# Patient Record
Sex: Male | Born: 1950 | Race: Black or African American | Hispanic: No | Smoking: Never smoker
Health system: Southern US, Community
[De-identification: ages and names within clinical notes are randomized; demographics above are authoritative.]

## PROBLEM LIST (undated history)

## (undated) DIAGNOSIS — D496 Neoplasm of unspecified behavior of brain: Secondary | ICD-10-CM

## (undated) DIAGNOSIS — I739 Peripheral vascular disease, unspecified: Secondary | ICD-10-CM

## (undated) DIAGNOSIS — I639 Cerebral infarction, unspecified: Secondary | ICD-10-CM

## (undated) DIAGNOSIS — J449 Chronic obstructive pulmonary disease, unspecified: Secondary | ICD-10-CM

## (undated) DIAGNOSIS — G629 Polyneuropathy, unspecified: Secondary | ICD-10-CM

## (undated) DIAGNOSIS — D649 Anemia, unspecified: Secondary | ICD-10-CM

## (undated) DIAGNOSIS — B1920 Unspecified viral hepatitis C without hepatic coma: Secondary | ICD-10-CM

## (undated) DIAGNOSIS — I10 Essential (primary) hypertension: Secondary | ICD-10-CM

## (undated) DIAGNOSIS — K7460 Unspecified cirrhosis of liver: Secondary | ICD-10-CM

## (undated) DIAGNOSIS — I509 Heart failure, unspecified: Secondary | ICD-10-CM

## (undated) DIAGNOSIS — R569 Unspecified convulsions: Secondary | ICD-10-CM

## (undated) DIAGNOSIS — I4891 Unspecified atrial fibrillation: Secondary | ICD-10-CM

## (undated) DIAGNOSIS — K819 Cholecystitis, unspecified: Secondary | ICD-10-CM

## (undated) DIAGNOSIS — N400 Enlarged prostate without lower urinary tract symptoms: Secondary | ICD-10-CM

## (undated) DIAGNOSIS — Z944 Liver transplant status: Secondary | ICD-10-CM

## (undated) DIAGNOSIS — Z992 Dependence on renal dialysis: Secondary | ICD-10-CM

## (undated) DIAGNOSIS — N186 End stage renal disease: Secondary | ICD-10-CM

## (undated) DIAGNOSIS — I2510 Atherosclerotic heart disease of native coronary artery without angina pectoris: Secondary | ICD-10-CM

## (undated) HISTORY — PX: TONSILLECTOMY: SUR1361

## (undated) HISTORY — PX: LIVER TRANSPLANT: SHX410

## (undated) HISTORY — PX: INSERTION OF DIALYSIS CATHETER: SHX1324

## (undated) HISTORY — PX: APPENDECTOMY: SHX54

---

## 2012-04-16 ENCOUNTER — Emergency Department (HOSPITAL_BASED_OUTPATIENT_CLINIC_OR_DEPARTMENT_OTHER): Payer: Medicare Other

## 2012-04-16 ENCOUNTER — Emergency Department (HOSPITAL_BASED_OUTPATIENT_CLINIC_OR_DEPARTMENT_OTHER)
Admission: EM | Admit: 2012-04-16 | Discharge: 2012-04-16 | Disposition: A | Discharge status: Other Acute Inpatient Hospital | Payer: Medicare Other | Attending: Emergency Medicine | Admitting: Emergency Medicine

## 2012-04-16 ENCOUNTER — Encounter (HOSPITAL_BASED_OUTPATIENT_CLINIC_OR_DEPARTMENT_OTHER): Payer: Self-pay | Admitting: Emergency Medicine

## 2012-04-16 DIAGNOSIS — E119 Type 2 diabetes mellitus without complications: Secondary | ICD-10-CM | POA: Insufficient documentation

## 2012-04-16 DIAGNOSIS — L03116 Cellulitis of left lower limb: Secondary | ICD-10-CM

## 2012-04-16 DIAGNOSIS — I120 Hypertensive chronic kidney disease with stage 5 chronic kidney disease or end stage renal disease: Secondary | ICD-10-CM | POA: Insufficient documentation

## 2012-04-16 DIAGNOSIS — Z8619 Personal history of other infectious and parasitic diseases: Secondary | ICD-10-CM | POA: Insufficient documentation

## 2012-04-16 DIAGNOSIS — Z992 Dependence on renal dialysis: Secondary | ICD-10-CM | POA: Insufficient documentation

## 2012-04-16 DIAGNOSIS — Z944 Liver transplant status: Secondary | ICD-10-CM | POA: Insufficient documentation

## 2012-04-16 DIAGNOSIS — L03115 Cellulitis of right lower limb: Secondary | ICD-10-CM

## 2012-04-16 DIAGNOSIS — Z794 Long term (current) use of insulin: Secondary | ICD-10-CM | POA: Insufficient documentation

## 2012-04-16 DIAGNOSIS — M79609 Pain in unspecified limb: Secondary | ICD-10-CM | POA: Insufficient documentation

## 2012-04-16 DIAGNOSIS — N186 End stage renal disease: Secondary | ICD-10-CM | POA: Insufficient documentation

## 2012-04-16 DIAGNOSIS — L02419 Cutaneous abscess of limb, unspecified: Secondary | ICD-10-CM | POA: Insufficient documentation

## 2012-04-16 HISTORY — DX: Polyneuropathy, unspecified: G62.9

## 2012-04-16 HISTORY — DX: Unspecified cirrhosis of liver: K74.60

## 2012-04-16 HISTORY — DX: Cerebral infarction, unspecified: I63.9

## 2012-04-16 HISTORY — DX: Essential (primary) hypertension: I10

## 2012-04-16 HISTORY — DX: Unspecified atrial fibrillation: I48.91

## 2012-04-16 HISTORY — DX: Chronic obstructive pulmonary disease, unspecified: J44.9

## 2012-04-16 HISTORY — DX: Benign prostatic hyperplasia without lower urinary tract symptoms: N40.0

## 2012-04-16 HISTORY — DX: Unspecified convulsions: R56.9

## 2012-04-16 HISTORY — DX: Dependence on renal dialysis: Z99.2

## 2012-04-16 HISTORY — DX: Heart failure, unspecified: I50.9

## 2012-04-16 HISTORY — DX: Anemia, unspecified: D64.9

## 2012-04-16 HISTORY — DX: Liver transplant status: Z94.4

## 2012-04-16 HISTORY — DX: End stage renal disease: N18.6

## 2012-04-16 HISTORY — DX: Atherosclerotic heart disease of native coronary artery without angina pectoris: I25.10

## 2012-04-16 HISTORY — DX: Peripheral vascular disease, unspecified: I73.9

## 2012-04-16 HISTORY — DX: Cholecystitis, unspecified: K81.9

## 2012-04-16 HISTORY — DX: Unspecified viral hepatitis C without hepatic coma: B19.20

## 2012-04-16 HISTORY — DX: Neoplasm of unspecified behavior of brain: D49.6

## 2012-04-16 LAB — CBC
MCH: 29.6 pg (ref 26.0–34.0)
MCHC: 33.8 g/dL (ref 30.0–36.0)
MCV: 87.5 fL (ref 78.0–100.0)
Platelets: 181 10*3/uL (ref 150–400)
RDW: 12.7 % (ref 11.5–15.5)

## 2012-04-16 LAB — BASIC METABOLIC PANEL
Calcium: 9.3 mg/dL (ref 8.4–10.5)
GFR calc Af Amer: 11 mL/min — ABNORMAL LOW (ref >90)
GFR calc non Af Amer: 10 mL/min — ABNORMAL LOW (ref >90)
Glucose, Bld: 201 mg/dL — ABNORMAL HIGH (ref 70–99)
Potassium: 3.7 mEq/L (ref 3.5–5.1)
Sodium: 134 mEq/L — ABNORMAL LOW (ref 135–145)

## 2012-04-16 LAB — DIFFERENTIAL
Basophils Relative: 0 % (ref 0–1)
Eosinophils Absolute: 0.2 10*3/uL (ref 0.0–0.7)
Eosinophils Relative: 5 % (ref 0–5)
Lymphs Abs: 1.5 10*3/uL (ref 0.7–4.0)
Neutrophils Relative %: 52 % (ref 43–77)

## 2012-04-16 MED ORDER — CEFAZOLIN SODIUM 1-5 GM-% IV SOLN
1.0000 g | Freq: Once | INTRAVENOUS | Status: AC
  Administered 2012-04-16: 1 g via INTRAVENOUS
  Filled 2012-04-16: qty 50

## 2012-04-16 MED ORDER — MORPHINE SULFATE 4 MG/ML IJ SOLN
4.0000 mg | Freq: Once | INTRAMUSCULAR | Status: AC
  Administered 2012-04-16: 4 mg via INTRAVENOUS
  Filled 2012-04-16: qty 1

## 2012-04-16 NOTE — ED Notes (Signed)
Family at bedside. 

## 2012-04-16 NOTE — ED Notes (Signed)
Pt reports fall was Tues, c/o continued pain to LLE

## 2012-04-16 NOTE — ED Notes (Signed)
Attempted x 3 to obtain IV access or blood sample- unable to obtain- MD aware- pt will go for radiology studies at this time before trying again

## 2012-04-16 NOTE — ED Notes (Signed)
Pt states he was standing in bathroom and had some difficulty with movement in legs, fell and now has some skin tears/abrasions to both lower legs.  Pt denies dizziness, loc or lightheadedness, pt relates he has problems moving his legs due to neuropathy.

## 2012-04-16 NOTE — ED Provider Notes (Signed)
History     CSN: [id number]  Arrival date & time 04/16/12  1013   First MD Initiated Contact with Patient 04/16/12 1058      Chief Complaint  Patient presents with  . Fall  . Abrasion    (Consider location/radiation/quality/duration/timing/severity/associated sxs/prior treatment) Patient is a 61 y.o. male presenting with fall. The history is provided by the patient.  Fall   he was in his bathroom on the commode and fell getting off of it. This occurred 4 days ago. He states that his legs were under in an awkward positions and needed assistance getting up. He is not ambulatory and has not been for 5 years. Since the fall, he is complaining of pain in his left hip and both lower legs. Pain is moderate and he rates it at 6/10. He denies fever, chills, sweats. His partner was worried because his legs were abraded and were looking worse.   Past Medical History  Diagnosis Date  . Diabetes mellitus   . Neuropathy   . Hypertension   . ESRD (end stage renal disease)   . Dialysis patient   . Hepatitis C   . Anemia   . Stroke   . PVD (peripheral vascular disease)   . Cirrhosis   . S/P liver transplant   . COPD (chronic obstructive pulmonary disease)   . Seizures   . Coronary artery disease   . CHF (congestive heart failure)   . A-fib   . Prostate hypertrophy   . Brain neoplasm   . Cholecystitis     Past Surgical History  Procedure Date  . Appendectomy     No family history on file.  History  Substance Use Topics  . Smoking status: Never Smoker   . Smokeless tobacco: Not on file  . Alcohol Use: No      Review of Systems  All other systems reviewed and are negative.    Allergies  Review of patient's allergies indicates no known allergies.  Home Medications   Current Outpatient Rx  Name Route Sig Dispense Refill  . ACETAMINOPHEN 325 MG PO TABS Oral Take 650 mg by mouth.    . NEPHROCAPS PO Oral Take by mouth.    Marland Kitchen GABAPENTIN 100 MG PO CAPS Oral Take 100 mg  by mouth.    . INSULIN GLARGINE 100 UNIT/ML [place] SOLN Subcutaneous Inject 42 Units into the skin.    . INSULIN ISOPHANE & REGULAR (70-30) 100 UNIT/ML [place] SUSP Subcutaneous Inject 10 Units into the skin.    Marland Kitchen LEVETIRACETAM 500 MG PO TABS Oral Take 500 mg by mouth.    . METOPROLOL SUCCINATE ER 25 MG PO TB24 Oral Take 25 mg by mouth.    Marland Kitchen MYCOPHENOLATE MOFETIL 250 MG PO CAPS Oral Take 250 mg by mouth.    . OXYCODONE HCL ER 10 MG PO TB12 Oral Take 5 mg by mouth.    Marland Kitchen PREDNISONE 5 MG PO TABS Oral Take 5 mg by mouth.    . SEVELAMER CARBONATE 800 MG PO TABS Oral Take 800 mg by mouth.    Marland Kitchen TACROLIMUS 1 MG PO CAPS Oral Take 1 mg by mouth.    Marland Kitchen URSODIOL 300 MG PO CAPS Oral Take 300 mg by mouth.      BP 135/96  Pulse 98  Temp 98.6 F (37 C) (Oral)  Resp 18  SpO2 100%  Physical Exam  Nursing note and vitals reviewed.  61 year old male is resting comfortably and in no acute distress. Vital  signs are significant for borderline hypertension with blood pressure 135/96. Oxygen saturation is 100% which is normal. Head is normocephalic and atraumatic. PERRLA, EOMI. Neck is nontender and supple. Back is nontender. Lungs are clear without rales, wheezes, or rhonchi. Heart has regular rate rhythm without murmur. Abdomen is soft, flat, nontender without masses or hepatosplenomegaly. Extremities: Skin tears are present over the lower legs of with erythema and warmth in that area.  Venous stasis changes are also present. No lymphangitic streaks are seen. There is soft tissue tenderness throughout both lower legs and also in the left lateral hip and thigh. No ecchymoses are seen. There is trace edema. Skin is warm and dry without other rash. Neurologic: Mental status is normal, cranial nerves are intact. There is marked weakness of both legs which the patient states is chronic. Arm strength is normal.  ED Course  Procedures (including critical care time)  Results for orders placed during the hospital encounter of  04/16/12  CBC      Component Value Range   WBC 5.3  4.0 - 10.5 K/uL   RBC 3.75 (*) 4.22 - 5.81 MIL/uL   Hemoglobin 11.1 (*) 13.0 - 17.0 g/dL   HCT 78.4 (*) 69.6 - 29.5 %   MCV 87.5  78.0 - 100.0 fL   MCH 29.6  26.0 - 34.0 pg   MCHC 33.8  30.0 - 36.0 g/dL   RDW 28.4  13.2 - 44.0 %   Platelets 181  150 - 400 K/uL  DIFFERENTIAL      Component Value Range   Neutrophils Relative 52  43 - 77 %   Neutro Abs 2.8  1.7 - 7.7 K/uL   Lymphocytes Relative 28  12 - 46 %   Lymphs Abs 1.5  0.7 - 4.0 K/uL   Monocytes Relative 16 (*) 3 - 12 %   Monocytes Absolute 0.8  0.1 - 1.0 K/uL   Eosinophils Relative 5  0 - 5 %   Eosinophils Absolute 0.2  0.0 - 0.7 K/uL   Basophils Relative 0  0 - 1 %   Basophils Absolute 0.0  0.0 - 0.1 K/uL  BASIC METABOLIC PANEL      Component Value Range   Sodium 134 (*) 135 - 145 mEq/L   Potassium 3.7  3.5 - 5.1 mEq/L   Chloride 97  96 - 112 mEq/L   CO2 29  19 - 32 mEq/L   Glucose, Bld 201 (*) 70 - 99 mg/dL   BUN 8  6 - 23 mg/dL   Creatinine, Ser 1.02 (*) 0.50 - 1.35 mg/dL   Calcium 9.3  8.4 - 72.5 mg/dL   GFR calc non Af Amer 10 (*) >90 mL/min   GFR calc Af Amer 11 (*) >90 mL/min  SEDIMENTATION RATE      Component Value Range   Sed Rate 75 (*) 0 - 16 mm/hr   Dg Femur Left  04/16/2012  *RADIOLOGY REPORT*  Clinical Data: History of fall with abrasion.  LEFT FEMUR - 2 VIEW  Comparison: No priors.  Findings: AP and lateral views of the left femur demonstrate postoperative changes of the intramedullary rod (gamma nail) and screw fixation.  No obvious periprostatic fracture is identified. The femur appears intact.  Soft tissues are unremarkable.  IMPRESSION: 1.  No acute abnormality of the left femur. 2.  Status post ORIF, as above.  Original Report Authenticated By: Florencia Reasons, M.D.   Dg Tibia/fibula Left  04/16/2012  *RADIOLOGY REPORT*  Clinical  Data: History of fall with of the brain agent and left leg pain.  LEFT TIBIA AND FIBULA - 2 VIEW  Comparison: No  priors.  Findings: AP and lateral views of the left tibia and fibula demonstrate no acute displaced fractures.  Overlying soft tissues are unremarkable.  IMPRESSION: 1.  No acute radiographic abnormality of the left tibia or fibula.  Original Report Authenticated By: Florencia Reasons, M.D.   Dg Tibia/fibula Right  04/16/2012  *RADIOLOGY REPORT*  Clinical Data: History of fall with abrasion.  RIGHT TIBIA AND FIBULA - 2 VIEW  Comparison: No priors.  Findings: AP lateral views of the right tibia and fibula demonstrate no acute displaced fractures.  Overlying soft tissues are unremarkable.  IMPRESSION: 1.  Negative for acute fracture of the right tibia or fibula.  Original Report Authenticated By: Florencia Reasons, M.D.   US Venous Img Lower Bilateral  04/16/2012  *RADIOLOGY REPORT*  Clinical Data: History of recent fall complaining of bilateral leg pain.  BILATERAL LOWER EXTREMITY VENOUS DUPLEX ULTRASOUND  Technique:  Gray-scale sonography with graded compression, as well as color Doppler and duplex ultrasound, were performed to evaluate the deep venous system of both lower extremities from the level of the common femoral vein through the popliteal and proximal calf veins.  Spectral Doppler was utilized to evaluate flow at rest and with distal augmentation maneuvers.  Comparison:  No priors.  Findings:  Normal compressibility of bilateral common femoral, superficial femoral, and popliteal veins is demonstrated, as well as the visualized proximal calf veins.  No filling defects to suggest DVT on grayscale or color Doppler imaging.  Doppler waveforms show normal direction of venous flow, normal respiratory phasicity and response to augmentation.  IMPRESSION: No evidence of deep vein thrombosis in either lower extremity.  Original Report Authenticated By: Florencia Reasons, M.D.      1. Cellulitis of leg, left   2. Cellulitis of leg, right   3. End stage renal disease on dialysis       MDM  Leg  pain with erythema and warmth which most likely represents cellulitis. Doppler will be obtained to rule out DVT. He is started on Ancef.  Laboratory was unable to obtain blood for testing. Right femoral vein blood draw was done by myself without difficulty. Prior to drawing blood, a timeout was performed and patient identified by stating his name and by armband. He tolerated the procedure well.  The patient needs to be admitted for inability to care for himself and progression of minor abrasions to cellulitis. He states that all of his medical care has been done at [facility] and prefers that he be admitted over there. Case is been discussed with Dr. Dierdre Forth at [facility] who agrees to accept the patient in transfer.     Dione Booze, MD 04/16/12 1620

## 2012-05-22 ENCOUNTER — Encounter (HOSPITAL_BASED_OUTPATIENT_CLINIC_OR_DEPARTMENT_OTHER): Payer: Self-pay | Admitting: Emergency Medicine

## 2012-05-22 ENCOUNTER — Emergency Department (HOSPITAL_BASED_OUTPATIENT_CLINIC_OR_DEPARTMENT_OTHER): Payer: Medicare Other

## 2012-05-22 ENCOUNTER — Emergency Department (HOSPITAL_BASED_OUTPATIENT_CLINIC_OR_DEPARTMENT_OTHER)
Admission: EM | Admit: 2012-05-22 | Discharge: 2012-05-22 | Disposition: A | Discharge status: Skilled Nursing Facility | Payer: Medicare Other | Attending: Emergency Medicine | Admitting: Emergency Medicine

## 2012-05-22 DIAGNOSIS — R51 Headache: Secondary | ICD-10-CM | POA: Insufficient documentation

## 2012-05-22 DIAGNOSIS — M549 Dorsalgia, unspecified: Secondary | ICD-10-CM | POA: Insufficient documentation

## 2012-05-22 DIAGNOSIS — Y9210 Unspecified residential institution as the place of occurrence of the external cause: Secondary | ICD-10-CM | POA: Insufficient documentation

## 2012-05-22 DIAGNOSIS — Z9089 Acquired absence of other organs: Secondary | ICD-10-CM | POA: Insufficient documentation

## 2012-05-22 DIAGNOSIS — M542 Cervicalgia: Secondary | ICD-10-CM | POA: Insufficient documentation

## 2012-05-22 DIAGNOSIS — I120 Hypertensive chronic kidney disease with stage 5 chronic kidney disease or end stage renal disease: Secondary | ICD-10-CM | POA: Insufficient documentation

## 2012-05-22 DIAGNOSIS — Z79899 Other long term (current) drug therapy: Secondary | ICD-10-CM | POA: Insufficient documentation

## 2012-05-22 DIAGNOSIS — Z794 Long term (current) use of insulin: Secondary | ICD-10-CM | POA: Insufficient documentation

## 2012-05-22 DIAGNOSIS — J4489 Other specified chronic obstructive pulmonary disease: Secondary | ICD-10-CM | POA: Insufficient documentation

## 2012-05-22 DIAGNOSIS — W19XXXA Unspecified fall, initial encounter: Secondary | ICD-10-CM

## 2012-05-22 DIAGNOSIS — J449 Chronic obstructive pulmonary disease, unspecified: Secondary | ICD-10-CM | POA: Insufficient documentation

## 2012-05-22 DIAGNOSIS — Z992 Dependence on renal dialysis: Secondary | ICD-10-CM | POA: Insufficient documentation

## 2012-05-22 DIAGNOSIS — Z8673 Personal history of transient ischemic attack (TIA), and cerebral infarction without residual deficits: Secondary | ICD-10-CM | POA: Insufficient documentation

## 2012-05-22 DIAGNOSIS — E119 Type 2 diabetes mellitus without complications: Secondary | ICD-10-CM | POA: Insufficient documentation

## 2012-05-22 DIAGNOSIS — W050XXA Fall from non-moving wheelchair, initial encounter: Secondary | ICD-10-CM | POA: Insufficient documentation

## 2012-05-22 DIAGNOSIS — N186 End stage renal disease: Secondary | ICD-10-CM | POA: Insufficient documentation

## 2012-05-22 MED ORDER — OXYCODONE-ACETAMINOPHEN 5-325 MG PO TABS
1.0000 | ORAL_TABLET | Freq: Once | ORAL | Status: AC
  Administered 2012-05-22: 1 via ORAL
  Filled 2012-05-22: qty 1

## 2012-05-22 NOTE — ED Notes (Signed)
Returned from CT.

## 2012-05-22 NOTE — ED Notes (Signed)
Patient transported to X-ray 

## 2012-05-22 NOTE — ED Notes (Signed)
Patient transported to CT 

## 2012-05-22 NOTE — ED Notes (Signed)
Pt. States that he was transferring from his bed to his w/c at the nursing home today when he fell (states breaks were loose on the w/c) pt. Denies loc. C/o lower back pain. Unsure if he hit his head or not. Pt. Arrived via [facility] with c/o lower back pain and frontal h/a. Pt. Has dried blood to right nares. States she picks at a scab that causes it to bleed. No other complaints.resp even and unlabored.

## 2012-05-22 NOTE — ED Notes (Signed)
Report called to [facility] at [facility]. Aware pt. Will be returning back to facility by PTAR .

## 2012-05-22 NOTE — ED Notes (Addendum)
[facility] called for transportation back to [facility] 

## 2012-05-22 NOTE — ED Notes (Signed)
ptar arrived and report given. Pt. Transported back to [facility].

## 2012-05-22 NOTE — ED Provider Notes (Addendum)
History     CSN: [id number]  Arrival date & time 05/22/12  0122   First MD Initiated Contact with Patient 05/22/12 0126      No chief complaint on file.   (Consider location/radiation/quality/duration/timing/severity/associated sxs/prior treatment) HPI Comments: At approximately noon yesterday patient was attempting to get from his bed to his wheelchair and fell onto his buttocks.  He did not lose consciousness.  Patient now has back pain.  He also complains of neck pain and mild headache.  Patient had remained at his nursing home throughout the day with they have been treating his pain with Vicodin.  He did not note any relief and the nursing home opted to send him to the emergency department.  Patient denies abdominal pain, nausea, vomiting, chest pain or shortness of breath.  There is no new weakness or numbness.  Patient uses a walker or wheelchair at baseline.  The history is provided by the patient and the EMS personnel.    Past Medical History  Diagnosis Date  . Diabetes mellitus   . Neuropathy   . Hypertension   . ESRD (end stage renal disease)   . Dialysis patient   . Hepatitis C   . Anemia   . Stroke   . PVD (peripheral vascular disease)   . Cirrhosis   . S/P liver transplant   . COPD (chronic obstructive pulmonary disease)   . Seizures   . Coronary artery disease   . CHF (congestive heart failure)   . A-fib   . Prostate hypertrophy   . Brain neoplasm   . Cholecystitis     Past Surgical History  Procedure Date  . Appendectomy     History reviewed. No pertinent family history.  History  Substance Use Topics  . Smoking status: Never Smoker   . Smokeless tobacco: Not on file  . Alcohol Use: No      Review of Systems  Constitutional: Negative.  Negative for fever and chills.  Eyes: Negative.   Respiratory: Negative.  Negative for cough and shortness of breath.   Cardiovascular: Negative.  Negative for chest pain.  Gastrointestinal: Negative.   Negative for nausea, vomiting and abdominal pain.  Genitourinary: Negative.   Musculoskeletal: Positive for back pain.  Skin: Negative.  Negative for color change and rash.  Neurological: Positive for headaches. Negative for syncope.  Hematological: Negative.  Negative for adenopathy.  Psychiatric/Behavioral: Negative.  Negative for confusion.  All other systems reviewed and are negative.    Allergies  Review of patient's allergies indicates no known allergies.  Home Medications   Current Outpatient Rx  Name Route Sig Dispense Refill  . GABAPENTIN 100 MG PO CAPS Oral Take 100 mg by mouth 2 (two) times daily.     . INSULIN GLARGINE 100 UNIT/ML [place] SOLN Subcutaneous Inject 50 Units into the skin at bedtime.     . INSULIN ISOPHANE & REGULAR (70-30) 100 UNIT/ML [place] SUSP Subcutaneous Inject 14 Units into the skin 3 (three) times daily as needed. According to sliding scale for blood sugar over 150    . LOPERAMIDE HCL 2 MG PO TABS Oral Take 2 mg by mouth 3 (three) times daily as needed. For diarrhea    . METOPROLOL SUCCINATE ER 25 MG PO TB24 Oral Take 6.25 mg by mouth 2 (two) times daily.     . OXYCODONE HCL ER 10 MG PO TB12 Oral Take 10 mg by mouth every 12 (twelve) hours as needed. For pain    . PREDNISONE  5 MG PO TABS Oral Take 5 mg by mouth daily.     Marland Kitchen SEVELAMER CARBONATE 800 MG PO TABS Oral Take 2,400 mg by mouth 3 (three) times daily with meals.    Marland Kitchen TACROLIMUS 1 MG PO CAPS Oral Take 1 mg by mouth 2 (two) times daily.     Marland Kitchen URSODIOL 300 MG PO CAPS Oral Take 300 mg by mouth 3 (three) times daily.       There were no vitals taken for this visit.  Physical Exam  Vitals reviewed. Constitutional: He is oriented to person, place, and time. He appears well-developed and well-nourished.  Non-toxic appearance. He does not have a sickly appearance.  HENT:  Head: Normocephalic and atraumatic.       No scalp hematomas or deformities noted.  Eyes: Conjunctivae, EOM and lids are normal. Pupils  are equal, round, and reactive to light.  Neck: Trachea normal, normal range of motion and full passive range of motion without pain. Neck supple.  Cardiovascular: Regular rhythm and normal heart sounds.   Pulmonary/Chest: Effort normal and breath sounds normal. No respiratory distress.  Abdominal: Soft. Normal appearance. He exhibits no distension. There is no tenderness. There is no rebound and no CVA tenderness.  Musculoskeletal: Normal range of motion.       No C-spine tenderness or thoracic tenderness on exam.  Patient does have upper L-spine tenderness over approximately L1-2.  Pelvis is stable.  No lower extremity tenderness on exam.  Neurological: He is alert and oriented to person, place, and time. He has normal strength.  Skin: Skin is warm, dry and intact. No rash noted.  Psychiatric: He has a normal mood and affect. His behavior is normal. Judgment and thought content normal.    ED Course  Procedures (including critical care time)  Dg Lumbar Spine Complete  05/22/2012  *RADIOLOGY REPORT*  Clinical Data: Larey Seat 01/02 pain after fall.  LUMBAR SPINE - COMPLETE 4+ VIEW  Comparison: None.  Findings: Five lumbar type vertebral bodies.  Diffuse bone demineralization.  Compression of the superior endplates of T12 and L1.  These are age indeterminate.  No displacement.  Mild ballooning of interspace at L1-2.  There is irregular change in the L4-5 D S with ballooning of the interspace and compression of L5. Disc sclerosis and possible destruction is present.  There appears to be coalition from L4-L5.  Changes may represent old changes of diskitis or postoperative change.  Degenerative changes in the facet joints.  IMPRESSION: Diffuse bone demineralization with blunting of interspaces suggesting osteoporosis.  Anterior compression of T12-L1, age indeterminate.  Acute fractures not excluded.  Described changes at L4-5 interspace may represent postoperative or changes of old diskitis.  Original Report  Authenticated By: Marlon Pel, M.D.   Ct Thoracic Spine Wo Contrast  05/22/2012  *RADIOLOGY REPORT*  Clinical Data:  Fall with pain.  Possible fractures of T12-L1 on plain films.  CT THORACIC AND LUMBAR SPINE WITHOUT CONTRAST  Technique:  Multidetector CT imaging of the thoracic and lumbar spine was performed without contrast. Multiplanar CT image reconstructions were also generated.  Comparison:  Plain radiographs 05/22/2012  CT THORACIC SPINE  Findings:  The normal alignment of the thoracic vertebrae.  Diffuse bone demineralization.  Endplate compression deformities at T10 and T12.  Associated endplate hypertrophic degenerative changes are present and there is no paraspinal hematoma at these levels, suggesting views to represent chronic change.  No retropulsion of fracture fragments.  No focal bone lesion or bone destruction.  IMPRESSION: Chronic-appearing compression fractures at T10 and T12, likely due to osteoporosis.  CT LUMBAR SPINE  Findings: Anterior endplate compression fractures of L1, L2, and L5.  There are associated degenerative changes at these levels and there is no significant paraspinal hematoma, suggesting these represent acute change.  No retropulsion of fracture fragments.  No focal bone lesion or bone destruction.  Postoperative changes in the left hip.  Diffuse bone demineralization.  14 mm exophytic lesion arising from the right kidney.  This is not definitely cystic and could represent a solid lesion.  Consider ultrasound for further evaluation to exclude a solid renal lesion.  IMPRESSION: Diffuse bone demineralization with compression fractures of L1, L2, and L5.  No acute changes are demonstrated, suggesting chronic compression, likely due to osteoporosis.  Indeterminate 14 mm lesion arising from the right kidney.  Original Report Authenticated By: Marlon Pel, M.D.   Ct Lumbar Spine Wo Contrast  05/22/2012  *RADIOLOGY REPORT*  Clinical Data:  Fall with pain.  Possible  fractures of T12-L1 on plain films.  CT THORACIC AND LUMBAR SPINE WITHOUT CONTRAST  Technique:  Multidetector CT imaging of the thoracic and lumbar spine was performed without contrast. Multiplanar CT image reconstructions were also generated.  Comparison:  Plain radiographs 05/22/2012  CT THORACIC SPINE  Findings:  The normal alignment of the thoracic vertebrae.  Diffuse bone demineralization.  Endplate compression deformities at T10 and T12.  Associated endplate hypertrophic degenerative changes are present and there is no paraspinal hematoma at these levels, suggesting views to represent chronic change.  No retropulsion of fracture fragments.  No focal bone lesion or bone destruction.  IMPRESSION: Chronic-appearing compression fractures at T10 and T12, likely due to osteoporosis.  CT LUMBAR SPINE  Findings: Anterior endplate compression fractures of L1, L2, and L5.  There are associated degenerative changes at these levels and there is no significant paraspinal hematoma, suggesting these represent acute change.  No retropulsion of fracture fragments.  No focal bone lesion or bone destruction.  Postoperative changes in the left hip.  Diffuse bone demineralization.  14 mm exophytic lesion arising from the right kidney.  This is not definitely cystic and could represent a solid lesion.  Consider ultrasound for further evaluation to exclude a solid renal lesion.  IMPRESSION: Diffuse bone demineralization with compression fractures of L1, L2, and L5.  No acute changes are demonstrated, suggesting chronic compression, likely due to osteoporosis.  Indeterminate 14 mm lesion arising from the right kidney.  Original Report Authenticated By: Marlon Pel, M.D.      MDM  Patient presents after a fall that occurred earlier in the day.  As he does have point tenderness over his L-spine I will obtain L-spine x-rays.  Patient received Percocet for pain control.  He has no bony C-spine tenderness his C-spine was  cleared clinically.  While patient notes a mild headache he did not have loss of consciousness and is alert and oriented at this time over 12 hours after the fall.  I do not feel the patient requires head CT without signs of trauma or altered mental status at this time.        Nat Christen, MD 05/22/12 [phone]  Patient with possible fracture seen on plain x-ray so a CT scan of his thoracic and lumbar spine will be ordered.  Nat Christen, MD 05/22/12 [phone]  Patient was CT scans that are consistent with chronic changes and nothing acute at this time.  Patient has been reassessed and  is much more comfortable at this time.  I had to wake him from sleep to discuss his test results with him.  I believe the patient is safe for discharge back to his nursing facility.  Nat Christen, MD 05/22/12 [phone]

## 2012-05-22 NOTE — ED Notes (Signed)
Pt. Smelled of urine on arrival. EMT provided incont care to pt.

## 2012-05-22 NOTE — ED Notes (Signed)
Returned from xray

## 2013-03-15 ENCOUNTER — Other Ambulatory Visit: Payer: Self-pay | Admitting: Geriatric Medicine

## 2013-12-15 ENCOUNTER — Other Ambulatory Visit: Payer: Self-pay | Admitting: *Deleted

## 2013-12-15 MED ORDER — OXYCODONE HCL 5 MG PO TABS: ORAL_TABLET | ORAL | Status: DC

## 2013-12-15 NOTE — Telephone Encounter (Signed)
[facility] 

## 2013-12-18 ENCOUNTER — Non-Acute Institutional Stay (SKILLED_NURSING_FACILITY): Payer: Medicare Other | Admitting: Nurse Practitioner

## 2013-12-18 DIAGNOSIS — J449 Chronic obstructive pulmonary disease, unspecified: Secondary | ICD-10-CM

## 2013-12-18 DIAGNOSIS — N186 End stage renal disease: Secondary | ICD-10-CM

## 2013-12-18 DIAGNOSIS — E119 Type 2 diabetes mellitus without complications: Secondary | ICD-10-CM

## 2013-12-18 DIAGNOSIS — M129 Arthropathy, unspecified: Secondary | ICD-10-CM

## 2013-12-18 DIAGNOSIS — S7290XA Unspecified fracture of unspecified femur, initial encounter for closed fracture: Secondary | ICD-10-CM

## 2013-12-18 DIAGNOSIS — R5381 Other malaise: Secondary | ICD-10-CM

## 2013-12-18 DIAGNOSIS — S7292XA Unspecified fracture of left femur, initial encounter for closed fracture: Secondary | ICD-10-CM

## 2013-12-18 DIAGNOSIS — J189 Pneumonia, unspecified organism: Secondary | ICD-10-CM

## 2013-12-18 DIAGNOSIS — M1990 Unspecified osteoarthritis, unspecified site: Secondary | ICD-10-CM

## 2013-12-18 DIAGNOSIS — Z944 Liver transplant status: Secondary | ICD-10-CM

## 2013-12-18 DIAGNOSIS — Z992 Dependence on renal dialysis: Secondary | ICD-10-CM

## 2013-12-19 ENCOUNTER — Encounter: Payer: Self-pay | Admitting: Nurse Practitioner

## 2013-12-19 DIAGNOSIS — S7292XA Unspecified fracture of left femur, initial encounter for closed fracture: Secondary | ICD-10-CM | POA: Insufficient documentation

## 2013-12-19 DIAGNOSIS — Z992 Dependence on renal dialysis: Secondary | ICD-10-CM

## 2013-12-19 DIAGNOSIS — Z944 Liver transplant status: Secondary | ICD-10-CM | POA: Insufficient documentation

## 2013-12-19 DIAGNOSIS — N186 End stage renal disease: Secondary | ICD-10-CM | POA: Insufficient documentation

## 2013-12-19 DIAGNOSIS — J449 Chronic obstructive pulmonary disease, unspecified: Secondary | ICD-10-CM | POA: Insufficient documentation

## 2013-12-19 DIAGNOSIS — R5381 Other malaise: Secondary | ICD-10-CM | POA: Insufficient documentation

## 2013-12-19 DIAGNOSIS — E119 Type 2 diabetes mellitus without complications: Secondary | ICD-10-CM | POA: Insufficient documentation

## 2013-12-19 DIAGNOSIS — J189 Pneumonia, unspecified organism: Secondary | ICD-10-CM | POA: Insufficient documentation

## 2013-12-19 DIAGNOSIS — M1990 Unspecified osteoarthritis, unspecified site: Secondary | ICD-10-CM | POA: Insufficient documentation

## 2013-12-19 NOTE — Progress Notes (Signed)
Patient ID: Tyrone Berry, male   DOB: 31-Oct-1951, 63 y.o.   MRN: [id number]    Nursing Home Location:  [place] of Service: SNF (31)  PCP: No primary provider on file.  No Known Allergies  Chief Complaint  Patient presents with  . Hospitalization Follow-up    HPI:  63 year old male who is at [place] for rehab and medical management after hospitalization for fall at home and found to have left femur fracture and sepsis due to pneumonia; pt was treated medical for fall and is now in split; has follow up in 2 weeks with ortho. pt with PMH of DM, HTN, ESRD on dialysis, liver transplant, COPD, arthritis with chronic pain, ORIF. Nursing reports pt complaining of pain and pain medications are not what he was taking prior to admission from hospital; pt with same complaint otherwise doing well  Review of Systems:  Review of Systems  Constitutional: Negative for fever and chills.  Respiratory: Negative for cough, sputum production and shortness of breath.   Cardiovascular: Negative for chest pain.  Gastrointestinal: Negative for heartburn, abdominal pain, diarrhea and constipation.  Genitourinary: Negative for dysuria.  Musculoskeletal: Positive for back pain, joint pain and myalgias.  Skin: Negative.   Neurological: Negative for dizziness and headaches.  Psychiatric/Behavioral: Negative for depression.     Past Medical History  Diagnosis Date  . Diabetes mellitus   . Neuropathy   . Hypertension   . ESRD (end stage renal disease)   . Dialysis patient   . Hepatitis C   . Anemia   . Stroke   . PVD (peripheral vascular disease)   . Cirrhosis   . S/P liver transplant   . COPD (chronic obstructive pulmonary disease)   . Seizures   . Coronary artery disease   . CHF (congestive heart failure)   . A-fib   . Prostate hypertrophy   . Brain neoplasm   . Cholecystitis    No past surgical history on file. Social History:   reports that he has never smoked.  He does not have any smokeless tobacco history on file. He reports that he does not drink alcohol or use illicit drugs.  No family history on file.  Medications: Patient's Medications  New Prescriptions   No medications on file  Previous Medications   AMITRIPTYLINE (ELAVIL) 50 MG TABLET    Take 50 mg by mouth daily.   CHOLECALCIFEROL (VITAMIN D) 1000 UNITS TABLET    Take 1,000 Units by mouth daily.   DILTIAZEM (CARDIZEM CD) 240 MG 24 HR CAPSULE    Take 240 mg by mouth daily.   EPOETIN ALFA (EPOGEN,PROCRIT) 62952 UNIT/ML INJECTION    10,000 Units 3 (three) times a week. At dialysis   FLUTICASONE (FLONASE) 50 MCG/ACT NASAL SPRAY    Place 1 spray into both nostrils daily.   FLUTICASONE-SALMETEROL (ADVAIR HFA) 45-21 MCG/ACT INHALER    Inhale 2 puffs into the lungs 2 (two) times daily.   GABAPENTIN (NEURONTIN) 100 MG CAPSULE    Take 100 mg by mouth 2 (two) times daily.    INSULIN GLARGINE (LANTUS) 100 UNIT/ML INJECTION    Inject 35 Units into the skin at bedtime.    LEVOTHYROXINE (SYNTHROID, LEVOTHROID) 50 MCG TABLET    Take 50 mcg by mouth daily before breakfast.   LOPERAMIDE (IMODIUM A-D) 2 MG TABLET    Take 2 mg by mouth 3 (three) times daily as needed. For diarrhea   MYCOPHENOLATE (CELLCEPT) 250 MG  CAPSULE    Take 250 mg by mouth 2 (two) times daily.   OXYCODONE (ROXICODONE) 5 MG IMMEDIATE RELEASE TABLET    Take one tablet by mouth every 4 hours as needed for mild to moderate pain; Take two tablets by mouth every 4 hours as needed for severe pain   PREDNISONE (DELTASONE) 5 MG TABLET    Take 5 mg by mouth daily.    SEVELAMER (RENVELA) 800 MG TABLET    Take 800 mg by mouth 3 (three) times daily with meals. Takes 4000 mg with meals   TACROLIMUS (PROGRAF) 1 MG CAPSULE    Take 1 mg by mouth 2 (two) times daily.    URSODIOL (ACTIGALL) 300 MG CAPSULE    Take 300 mg by mouth 3 (three) times daily.   Modified Medications   No medications on file  Discontinued Medications   INSULIN NPH-INSULIN  REGULAR (NOVOLIN 70/30) (70-30) 100 UNIT/ML INJECTION    Inject 14 Units into the skin 3 (three) times daily as needed. According to sliding scale for blood sugar over 150   LEVETIRACETAM (KEPPRA) 500 MG TABLET    Take 500 mg by mouth daily.   METOPROLOL SUCCINATE (TOPROL-XL) 25 MG 24 HR TABLET    Take 6.25 mg by mouth 2 (two) times daily.    OXYCODONE (OXYCONTIN) 10 MG 12 HR TABLET    Take 10 mg by mouth every 12 (twelve) hours as needed. For pain   TAMSULOSIN HCL (FLOMAX) 0.4 MG CAPS    Take 0.4 mg by mouth.     Physical Exam: Physical Exam  Constitutional: He is well-developed, well-nourished, and in no distress.  HENT:  Mouth/Throat: Oropharynx is clear and moist. No oropharyngeal exudate.  Eyes: Conjunctivae and EOM are normal. Pupils are equal, round, and reactive to light.  Neck: Normal range of motion. Neck supple.  Cardiovascular: Normal rate, regular rhythm and normal heart sounds.   Pulmonary/Chest: Effort normal and breath sounds normal. No respiratory distress.  Dialysis cath on right upper chest wall  Abdominal: Soft. Bowel sounds are normal. He exhibits no distension.  Musculoskeletal: He exhibits no edema.  Neurological: He is alert.  Skin: Skin is warm and dry.  Psychiatric: Affect normal.    Filed Vitals:   12/18/13 1207  BP: 114/84  Pulse: 95  Temp: 97.4 F (36.3 C)  Resp: 20       Assessment/Plan 1. Pneumonia -finished course of Levaquin; no ongoing symptoms    2. Femur fracture, left -s/p fall now with femur fracture, in split currently; has not had surgery-- will follo wup with ortho in 2 weeks -pain not well controlled  -will increase medication at this time, will request records from pain management clinic  3. ESRD on dialysis MWF dialysis  4. Hx of liver transplant -conts on rejection prophylaxis medication  5. Debility -s/p fall, here at [facility] for therapy however he is currently non weight bearing; following with ortho  6.  Diabetes -conts on levimir and SSI; will cont to monitor and adjust medication as needed  7. COPD (chronic obstructive pulmonary disease) -no changes in shortness of breath, cough or sputum production; stable on advair  8. Arthritis -with CHRONIC pain, appears to be worse now due to fracture, pt reports pain was managed by outpt pain management and he was getting oxy IR 30 mg q 4 hours PRN, no record of this that I can find, will increase Oxy IR to 20 mg q 6 AS NEEDED and request records from  pain management

## 2013-12-26 ENCOUNTER — Non-Acute Institutional Stay (SKILLED_NURSING_FACILITY): Payer: Medicare Other | Admitting: Internal Medicine

## 2013-12-26 DIAGNOSIS — E1129 Type 2 diabetes mellitus with other diabetic kidney complication: Secondary | ICD-10-CM

## 2013-12-26 DIAGNOSIS — E1149 Type 2 diabetes mellitus with other diabetic neurological complication: Secondary | ICD-10-CM

## 2013-12-26 DIAGNOSIS — E1165 Type 2 diabetes mellitus with hyperglycemia: Secondary | ICD-10-CM

## 2013-12-26 DIAGNOSIS — S7290XA Unspecified fracture of unspecified femur, initial encounter for closed fracture: Secondary | ICD-10-CM

## 2013-12-26 DIAGNOSIS — J449 Chronic obstructive pulmonary disease, unspecified: Secondary | ICD-10-CM

## 2013-12-26 DIAGNOSIS — S7292XA Unspecified fracture of left femur, initial encounter for closed fracture: Secondary | ICD-10-CM
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
## 2014-01-08 ENCOUNTER — Other Ambulatory Visit: Payer: Self-pay | Admitting: *Deleted

## 2014-01-08 MED ORDER — OXYCODONE HCL 10 MG PO TABS: ORAL_TABLET | ORAL | Status: DC

## 2014-01-08 MED ORDER — OXYCODONE HCL 5 MG PO TABS: ORAL_TABLET | ORAL | Status: DC

## 2014-01-08 NOTE — Telephone Encounter (Signed)
[facility] 

## 2014-01-15 ENCOUNTER — Non-Acute Institutional Stay (SKILLED_NURSING_FACILITY): Payer: Medicare Other | Admitting: Nurse Practitioner

## 2014-01-15 DIAGNOSIS — Z992 Dependence on renal dialysis: Secondary | ICD-10-CM

## 2014-01-15 DIAGNOSIS — J4489 Other specified chronic obstructive pulmonary disease: Secondary | ICD-10-CM

## 2014-01-15 DIAGNOSIS — Z944 Liver transplant status: Secondary | ICD-10-CM

## 2014-01-15 DIAGNOSIS — E119 Type 2 diabetes mellitus without complications: Secondary | ICD-10-CM

## 2014-01-15 DIAGNOSIS — M1990 Unspecified osteoarthritis, unspecified site: Secondary | ICD-10-CM

## 2014-01-15 DIAGNOSIS — N186 End stage renal disease: Secondary | ICD-10-CM

## 2014-01-15 DIAGNOSIS — S7292XA Unspecified fracture of left femur, initial encounter for closed fracture: Secondary | ICD-10-CM

## 2014-01-15 DIAGNOSIS — M129 Arthropathy, unspecified: Secondary | ICD-10-CM

## 2014-01-15 DIAGNOSIS — S7290XA Unspecified fracture of unspecified femur, initial encounter for closed fracture: Secondary | ICD-10-CM

## 2014-01-15 DIAGNOSIS — J449 Chronic obstructive pulmonary disease, unspecified: Secondary | ICD-10-CM

## 2014-01-15 NOTE — Progress Notes (Signed)
Patient ID: Tyrone Berry, male   DOB: Jul 15, 1951, 63 y.o.   MRN: [id number]   Nursing Home Location:  [place] of Service: SNF (31)  PCP: No primary provider on file.  No Known Allergies  Chief Complaint  Patient presents with  . Medical Managment of Chronic Issues    HPI:  Tyrone Berry is a 62 year old male patient, at facility for rehab therapy following a left femur fracture. He also is a renal patient, and has been tolerating HD without issue. He is tolerating anti-rejection medications (liver transplant) without side effect. His COPD has been stable and without exacerbation on room air. His blood sugars have been controlled, despite steroid therapy. He has attended HD today and returns to facility without complaint. He has a fistula to his right arm that is maturing. Meanwhile, he has a right subclavian Perm-Cath for dialysis treatments. He uses an overhead trapeze for transfers and reports adequate pain management. He believes he has a f/u appointment with ortho (Dr. Linton Rump) and the nursing staff is checking into this.    Review of Systems:  Review of Systems  Constitutional: Negative for fever and chills.  Respiratory: Negative for cough, sputum production, shortness of breath and wheezing.   Cardiovascular: Negative for chest pain, palpitations and leg swelling.  Gastrointestinal: Negative for heartburn, nausea, vomiting, abdominal pain, diarrhea and constipation.  Genitourinary: Negative for dysuria and frequency.  Musculoskeletal: Negative for falls.  Neurological: Negative for dizziness, sensory change, loss of consciousness and weakness.  Endo/Heme/Allergies: Negative for polydipsia.  Psychiatric/Behavioral: Negative for depression. The patient is not nervous/anxious and does not have insomnia.      Past Medical History  Diagnosis Date  . Diabetes mellitus   . Neuropathy   . Hypertension   . ESRD (end stage renal disease)   . Dialysis patient     . Hepatitis C   . Anemia   . Stroke   . PVD (peripheral vascular disease)   . Cirrhosis   . S/P liver transplant   . COPD (chronic obstructive pulmonary disease)   . Seizures   . Coronary artery disease   . CHF (congestive heart failure)   . A-fib   . Prostate hypertrophy   . Brain neoplasm   . Cholecystitis    No past surgical history on file. Social History:   reports that he has never smoked. He does not have any smokeless tobacco history on file. He reports that he does not drink alcohol or use illicit drugs.  No family history on file.  Medications: Patient's Medications  New Prescriptions   No medications on file  Previous Medications   AMITRIPTYLINE (ELAVIL) 50 MG TABLET    Take 50 mg by mouth daily.   CHOLECALCIFEROL (VITAMIN D) 1000 UNITS TABLET    Take 1,000 Units by mouth daily.   DILTIAZEM (CARDIZEM CD) 240 MG 24 HR CAPSULE    Take 240 mg by mouth daily.   EPOETIN ALFA (EPOGEN,PROCRIT) 99242 UNIT/ML INJECTION    10,000 Units 3 (three) times a week. At dialysis   FLUTICASONE (FLONASE) 50 MCG/ACT NASAL SPRAY    Place 1 spray into both nostrils daily.   FLUTICASONE-SALMETEROL (ADVAIR HFA) 45-21 MCG/ACT INHALER    Inhale 2 puffs into the lungs 2 (two) times daily.   GABAPENTIN (NEURONTIN) 100 MG CAPSULE    Take 100 mg by mouth 2 (two) times daily.    INSULIN GLARGINE (LANTUS) 100 UNIT/ML INJECTION    Inject  35 Units into the skin at bedtime.    LEVOTHYROXINE (SYNTHROID, LEVOTHROID) 50 MCG TABLET    Take 50 mcg by mouth daily before breakfast.   LOPERAMIDE (IMODIUM A-D) 2 MG TABLET    Take 2 mg by mouth 3 (three) times daily as needed. For diarrhea   MYCOPHENOLATE (CELLCEPT) 250 MG CAPSULE    Take 250 mg by mouth 2 (two) times daily.   OXYCODONE (ROXICODONE) 5 MG IMMEDIATE RELEASE TABLET    Take one tablet by mouth every 4 hours as needed for mild to moderate pain; Take two tablets by mouth every 4 hours as needed for severe pain   OXYCODONE HCL 10 MG TABS    Take two  tablets by mouth every 6 hours as needed for pain   PREDNISONE (DELTASONE) 5 MG TABLET    Take 5 mg by mouth daily.    SEVELAMER (RENVELA) 800 MG TABLET    Take 800 mg by mouth 3 (three) times daily with meals. Takes 4000 mg with meals   TACROLIMUS (PROGRAF) 1 MG CAPSULE    Take 1 mg by mouth 2 (two) times daily.    URSODIOL (ACTIGALL) 300 MG CAPSULE    Take 300 mg by mouth 3 (three) times daily.   Modified Medications   No medications on file  Discontinued Medications   No medications on file     Physical Exam: Physical Exam  Nursing note and vitals reviewed. Constitutional: He is well-developed, well-nourished, and in no distress.  HENT:  Head: Normocephalic and atraumatic.  Eyes: Pupils are equal, round, and reactive to light.  Neck: Normal range of motion. Neck supple.  Cardiovascular: Normal rate and regular rhythm.  Exam reveals no gallop.   No murmur heard. Pulmonary/Chest: Effort normal and breath sounds normal. No respiratory distress. He exhibits no tenderness.  Abdominal: He exhibits no distension and no mass. There is no tenderness. There is no guarding.  Musculoskeletal: He exhibits no tenderness. Edema: trace left foot.  Left leg in ortho immobilization splint. Good pulse, cap refill, and movement present to left leg.  Neurological: He is alert. No cranial nerve deficit. Abnormal coordination: related to left femur fracture.  Skin: Skin is warm and dry. No erythema. No pallor.  Psychiatric: Mood, affect and judgment normal.    Filed Vitals:   01/15/14 1354  BP: 128/69  Pulse: 70  Temp: 98.2 F (36.8 C)  Resp: 20  Weight: 216 lb 9.6 oz (98.249 kg)       Assessment/Plan 1. COPD (chronic obstructive pulmonary disease) Stable on room air. Will continue Advair inhaler and monitor for any exacerbations.   2. Diabetes Controlled with current regimen. Will continue as ordered and monitor for hyperglycemia due to steroid use.   3. Arthritis No acute pain  exacerbations. Will continue management with prn Oxycodone.  4. ESRD on dialysis Had dialysis today without issue. Permcath with site to right SCV that is without complication or infection. Will request any HD labs for chart.   5. Hx of liver transplant Continues anti-rejection meds, including Progaf and Cellcept. Monitor levels as appropriated. Patient reports his next f/u is in June with Dr. Lorne Skeens office.   6 Femur fracture, left  -s/p fall now with femur fracture, conts in splent; has ortho follow up-  -pain better controlled  -cont to work with therapy without weight bearing    Aianna Fahs Debria Garret, NP

## 2014-01-30 ENCOUNTER — Other Ambulatory Visit: Payer: Self-pay | Admitting: *Deleted

## 2014-01-30 MED ORDER — OXYCODONE HCL 10 MG PO TABS: ORAL_TABLET | ORAL | Status: DC

## 2014-01-30 NOTE — Telephone Encounter (Signed)
[facility] 

## 2014-02-06 ENCOUNTER — Non-Acute Institutional Stay (SKILLED_NURSING_FACILITY): Payer: Medicare Other | Admitting: Internal Medicine

## 2014-02-06 DIAGNOSIS — E1129 Type 2 diabetes mellitus with other diabetic kidney complication: Secondary | ICD-10-CM

## 2014-02-06 DIAGNOSIS — E1165 Type 2 diabetes mellitus with hyperglycemia: Principal | ICD-10-CM

## 2014-02-06 DIAGNOSIS — Z944 Liver transplant status: Secondary | ICD-10-CM

## 2014-02-07 NOTE — Progress Notes (Addendum)
Patient ID: Tyrone Berry, male   DOB: 1950-12-16, 63 y.o.   MRN: [id number]                   PROGRESS NOTE  DATE:  02/06/2014    FACILITY: [facility]    LEVEL OF CARE:   SNF   Acute Visit   CHIEF COMPLAINT:  Review of medical issues.     HISTORY OF PRESENT ILLNESS:  Tyrone Berry is a 63 year-old man who came to [place] after a fall with a left distal femur fracture.  This did not receive surgical repair.  He has been following up with a [facility] orthopedic surgeon, Dr. Linton Rump.    He also has an extensive medical history including diabetes with chronic renal failure, on dialysis.    He also has a history of a liver transplant in 2005 which the patient states was secondary to alcoholic cirrhosis.  The consultant pharmacist has pointed out that the patient is on Ursodiol.  The patient really does not know anything about this.  Although the primary use of this drug is in patients with cholelithiasis, there are usages with certain types of liver disease including primary biliary cirrhosis and/or non-alcoholic steatohepatitis.  The patient is not even aware of whether he has a gallbladder in place.   He does not complain of abdominal pain.  He has a follow-up with a Dr. Gwendel Hanson in June at [facility], who I believe is a transplant hepatologist.    With regards to his diabetes, he gets 35 U of Lantus as well as an insulin sliding scale.  Most of his blood sugars appear to be fairly well controlled.  However, he appears to spike episodically.  For instance, he was 315 a.c. lunch today without any particular reason.   He had a quite normal blood sugar yesterday at the same time.     PHYSICAL EXAMINATION:   GENERAL APPEARANCE:  The patient looks well.   CARDIOVASCULAR:  CARDIAC:  Exam is normal.  He appears to be euvolemic.   GASTROINTESTINAL:  LIVER/SPLEEN/KIDNEYS:  No liver, no spleen.  No tenderness.   ABDOMEN:  No masses.   MUSCULOSKELETAL:   EXTREMITIES:   LEFT LOWER EXTREMITY:  He has a long-leg  brace on the fractured leg.  I did not remove this.    ASSESSMENT/PLAN:  Type 2 diabetes with chronic renal failure.  He is on dialysis.    History of a liver transplant.  He is on prednisone 5 mg daily, Prograf 1 mg b.i.d., Cellcept 250 b.i.d.  He is on Ursodiol 300  t.i.d., although the reasons behind this are not completely clear.    We have no lab work on the chart.  I think most of this is being followed at dialysis.  I note that there were orders written last month to obtain copies of labs from dialysis, although I do not see these on the chart.  He definitely needs a hemoglobin A1c. Will verify with his dialysis center that they are indeed following these issues

## 2014-05-17 ENCOUNTER — Ambulatory Visit: Payer: Self-pay | Admitting: Vascular Surgery

## 2014-05-30 ENCOUNTER — Encounter (HOSPITAL_COMMUNITY): Payer: Self-pay | Admitting: *Deleted

## 2014-05-30 ENCOUNTER — Other Ambulatory Visit: Payer: Self-pay

## 2014-05-30 MED ORDER — DEXTROSE 5 % IV SOLN: 1.5000 g | INTRAVENOUS | Status: DC

## 2014-05-30 MED ORDER — SODIUM CHLORIDE 0.9 % IV SOLN: INTRAVENOUS | Status: DC

## 2014-05-30 MED ORDER — DEXTROSE 5 % IV SOLN
1.5000 g | INTRAVENOUS | Status: AC
  Administered 2014-05-31: 1.5 g via INTRAVENOUS
  Filled 2014-05-30 (×2): qty 1.5

## 2014-05-30 NOTE — Progress Notes (Signed)
Pt resides at [facility]. Spoke with Glenard Haring, nurse for Mr. Zeck. She verified allergies, meds, and medical history. Does not have surgical history. She states pt is alert and oriented and able to speak for himself.  Gave her pre-op instructions, the facility will be transporting him to the hospital. Glenard Haring states pt is able to  notify his family himself.

## 2014-05-31 ENCOUNTER — Ambulatory Visit (HOSPITAL_COMMUNITY): Payer: Medicare Other

## 2014-05-31 ENCOUNTER — Ambulatory Visit (HOSPITAL_COMMUNITY)
Admission: RE | Admit: 2014-05-31 | Discharge: 2014-05-31 | Disposition: A | Discharge status: Skilled Nursing Facility | Payer: Medicare Other | Source: Ambulatory Visit | Attending: Vascular Surgery | Admitting: Vascular Surgery

## 2014-05-31 ENCOUNTER — Ambulatory Visit (HOSPITAL_COMMUNITY): Payer: Medicare Other | Admitting: Anesthesiology

## 2014-05-31 ENCOUNTER — Encounter (HOSPITAL_COMMUNITY): Payer: Medicare Other | Admitting: Anesthesiology

## 2014-05-31 ENCOUNTER — Encounter (HOSPITAL_COMMUNITY): Payer: Self-pay | Admitting: *Deleted

## 2014-05-31 ENCOUNTER — Encounter (HOSPITAL_COMMUNITY)
Admission: RE | Disposition: A | Discharge status: Skilled Nursing Facility | Payer: Self-pay | Source: Ambulatory Visit | Attending: Vascular Surgery

## 2014-05-31 DIAGNOSIS — Z8679 Personal history of other diseases of the circulatory system: Secondary | ICD-10-CM | POA: Insufficient documentation

## 2014-05-31 DIAGNOSIS — E119 Type 2 diabetes mellitus without complications: Secondary | ICD-10-CM | POA: Insufficient documentation

## 2014-05-31 DIAGNOSIS — Z8673 Personal history of transient ischemic attack (TIA), and cerebral infarction without residual deficits: Secondary | ICD-10-CM | POA: Diagnosis not present

## 2014-05-31 DIAGNOSIS — I120 Hypertensive chronic kidney disease with stage 5 chronic kidney disease or end stage renal disease: Secondary | ICD-10-CM | POA: Insufficient documentation

## 2014-05-31 DIAGNOSIS — B182 Chronic viral hepatitis C: Secondary | ICD-10-CM | POA: Insufficient documentation

## 2014-05-31 DIAGNOSIS — I509 Heart failure, unspecified: Secondary | ICD-10-CM | POA: Insufficient documentation

## 2014-05-31 DIAGNOSIS — Z992 Dependence on renal dialysis: Secondary | ICD-10-CM | POA: Insufficient documentation

## 2014-05-31 DIAGNOSIS — J449 Chronic obstructive pulmonary disease, unspecified: Secondary | ICD-10-CM | POA: Diagnosis not present

## 2014-05-31 DIAGNOSIS — I2510 Atherosclerotic heart disease of native coronary artery without angina pectoris: Secondary | ICD-10-CM | POA: Diagnosis not present

## 2014-05-31 DIAGNOSIS — I252 Old myocardial infarction: Secondary | ICD-10-CM | POA: Diagnosis not present

## 2014-05-31 DIAGNOSIS — D649 Anemia, unspecified: Secondary | ICD-10-CM | POA: Diagnosis not present

## 2014-05-31 DIAGNOSIS — N186 End stage renal disease: Secondary | ICD-10-CM | POA: Insufficient documentation

## 2014-05-31 DIAGNOSIS — N185 Chronic kidney disease, stage 5: Secondary | ICD-10-CM

## 2014-05-31 DIAGNOSIS — Z794 Long term (current) use of insulin: Secondary | ICD-10-CM | POA: Diagnosis not present

## 2014-05-31 DIAGNOSIS — Z944 Liver transplant status: Secondary | ICD-10-CM | POA: Diagnosis not present

## 2014-05-31 DIAGNOSIS — J4489 Other specified chronic obstructive pulmonary disease: Secondary | ICD-10-CM | POA: Insufficient documentation

## 2014-05-31 HISTORY — PX: INSERTION OF DIALYSIS CATHETER: SHX1324

## 2014-05-31 LAB — POCT I-STAT 4, (NA,K, GLUC, HGB,HCT)
Glucose, Bld: 152 mg/dL — ABNORMAL HIGH (ref 70–99)
HCT: 40 % (ref 39.0–52.0)
Hemoglobin: 13.6 g/dL (ref 13.0–17.0)
POTASSIUM: 5 meq/L (ref 3.7–5.3)
Sodium: 131 mEq/L — ABNORMAL LOW (ref 137–147)

## 2014-05-31 LAB — GLUCOSE, CAPILLARY
GLUCOSE-CAPILLARY: 145 mg/dL — AB (ref 70–99)
Glucose-Capillary: 139 mg/dL — ABNORMAL HIGH (ref 70–99)
Glucose-Capillary: 146 mg/dL — ABNORMAL HIGH (ref 70–99)

## 2014-05-31 SURGERY — INSERTION OF DIALYSIS CATHETER
Anesthesia: Monitor Anesthesia Care | Site: Groin | Laterality: Right

## 2014-05-31 MED ORDER — GLYCOPYRROLATE 0.2 MG/ML IJ SOLN
INTRAMUSCULAR | Status: AC
  Filled 2014-05-31: qty 2

## 2014-05-31 MED ORDER — SODIUM CHLORIDE 0.9 % IR SOLN
Status: DC | PRN
  Administered 2014-05-31: 10:00:00

## 2014-05-31 MED ORDER — HEPARIN SODIUM (PORCINE) 1000 UNIT/ML IJ SOLN
INTRAMUSCULAR | Status: DC | PRN
  Administered 2014-05-31: 1000 [IU]

## 2014-05-31 MED ORDER — FENTANYL CITRATE 0.05 MG/ML IJ SOLN
INTRAMUSCULAR | Status: AC
  Filled 2014-05-31: qty 5

## 2014-05-31 MED ORDER — ONDANSETRON HCL 4 MG/2ML IJ SOLN: 4.0000 mg | Freq: Once | INTRAMUSCULAR | Status: DC | PRN

## 2014-05-31 MED ORDER — NEOSTIGMINE METHYLSULFATE 10 MG/10ML IV SOLN
INTRAVENOUS | Status: AC
  Filled 2014-05-31: qty 1

## 2014-05-31 MED ORDER — SODIUM CHLORIDE 0.9 % IV SOLN: INTRAVENOUS | Status: DC | PRN

## 2014-05-31 MED ORDER — MIDAZOLAM HCL 5 MG/5ML IJ SOLN
INTRAMUSCULAR | Status: DC | PRN
  Administered 2014-05-31: 2 mg via INTRAVENOUS

## 2014-05-31 MED ORDER — MIDAZOLAM HCL 2 MG/2ML IJ SOLN
INTRAMUSCULAR | Status: AC
  Filled 2014-05-31: qty 2

## 2014-05-31 MED ORDER — CHLORHEXIDINE GLUCONATE CLOTH 2 % EX PADS: 6.0000 | MEDICATED_PAD | Freq: Once | CUTANEOUS | Status: DC

## 2014-05-31 MED ORDER — SODIUM CHLORIDE 0.9 % IV SOLN
INTRAVENOUS | Status: DC | PRN
  Administered 2014-05-31: 11:00:00 via INTRAVENOUS

## 2014-05-31 MED ORDER — IOHEXOL 300 MG/ML  SOLN
INTRAMUSCULAR | Status: DC | PRN
  Administered 2014-05-31: 50 mL via INTRAVENOUS

## 2014-05-31 MED ORDER — HEPARIN SODIUM (PORCINE) 1000 UNIT/ML IJ SOLN
INTRAMUSCULAR | Status: AC
  Filled 2014-05-31: qty 1

## 2014-05-31 MED ORDER — LIDOCAINE HCL (PF) 1 % IJ SOLN
INTRAMUSCULAR | Status: AC
  Filled 2014-05-31: qty 30

## 2014-05-31 MED ORDER — ONDANSETRON HCL 4 MG/2ML IJ SOLN
INTRAMUSCULAR | Status: AC
  Filled 2014-05-31: qty 2

## 2014-05-31 MED ORDER — FENTANYL CITRATE 0.05 MG/ML IJ SOLN
INTRAMUSCULAR | Status: DC | PRN
  Administered 2014-05-31: 100 ug via INTRAVENOUS

## 2014-05-31 MED ORDER — LIDOCAINE-EPINEPHRINE (PF) 1 %-1:200000 IJ SOLN
INTRAMUSCULAR | Status: AC
  Filled 2014-05-31: qty 10

## 2014-05-31 MED ORDER — MEPERIDINE HCL 25 MG/ML IJ SOLN: 6.2500 mg | INTRAMUSCULAR | Status: DC | PRN

## 2014-05-31 MED ORDER — ARTIFICIAL TEARS OP OINT
TOPICAL_OINTMENT | OPHTHALMIC | Status: AC
  Filled 2014-05-31: qty 3.5

## 2014-05-31 MED ORDER — FENTANYL CITRATE 0.05 MG/ML IJ SOLN: 25.0000 ug | INTRAMUSCULAR | Status: DC | PRN

## 2014-05-31 MED ORDER — PROPOFOL 10 MG/ML IV BOLUS
INTRAVENOUS | Status: AC
  Filled 2014-05-31: qty 20

## 2014-05-31 MED ORDER — ROCURONIUM BROMIDE 50 MG/5ML IV SOLN
INTRAVENOUS | Status: AC
  Filled 2014-05-31: qty 1

## 2014-05-31 MED ORDER — LIDOCAINE-EPINEPHRINE (PF) 1 %-1:200000 IJ SOLN
INTRAMUSCULAR | Status: DC | PRN
  Administered 2014-05-31: 30 mL

## 2014-05-31 MED ORDER — 0.9 % SODIUM CHLORIDE (POUR BTL) OPTIME
TOPICAL | Status: DC | PRN
  Administered 2014-05-31: 1000 mL

## 2014-05-31 SURGICAL SUPPLY — 44 items
BAG DECANTER FOR FLEXI CONT (MISCELLANEOUS) ×3 IMPLANT
BLADE 10 SAFETY STRL DISP (BLADE) IMPLANT
CATH BEACON 5.038 65CM KMP-01 (CATHETERS) ×3 IMPLANT
CATH CANNON HEMO 15F 50CM (CATHETERS) ×3 IMPLANT
CATH CANNON HEMO 15FR 19 (HEMODIALYSIS SUPPLIES) IMPLANT
CATH CANNON HEMO 15FR 23CM (HEMODIALYSIS SUPPLIES) IMPLANT
CATH CANNON HEMO 15FR 31CM (HEMODIALYSIS SUPPLIES) IMPLANT
CATH CANNON HEMO 15FR 32CM (HEMODIALYSIS SUPPLIES) IMPLANT
CHLORAPREP W/TINT 26ML (MISCELLANEOUS) ×3 IMPLANT
COVER PROBE W GEL 5X96 (DRAPES) ×3 IMPLANT
COVER SURGICAL LIGHT HANDLE (MISCELLANEOUS) ×3 IMPLANT
DRAPE C-ARM 42X72 X-RAY (DRAPES) ×3 IMPLANT
DRAPE CHEST BREAST 15X10 FENES (DRAPES) ×3 IMPLANT
GAUZE SPONGE 2X2 8PLY STRL LF (GAUZE/BANDAGES/DRESSINGS) ×1 IMPLANT
GAUZE SPONGE 4X4 16PLY XRAY LF (GAUZE/BANDAGES/DRESSINGS) ×3 IMPLANT
GLOVE BIO SURGEON STRL SZ7.5 (GLOVE) ×3 IMPLANT
GLOVE BIOGEL PI IND STRL 7.0 (GLOVE) ×1 IMPLANT
GLOVE BIOGEL PI IND STRL 8 (GLOVE) ×1 IMPLANT
GLOVE BIOGEL PI INDICATOR 7.0 (GLOVE) ×2
GLOVE BIOGEL PI INDICATOR 8 (GLOVE) ×2
GLOVE SURG SS PI 7.0 STRL IVOR (GLOVE) ×6 IMPLANT
GOWN STRL REUS W/ TWL LRG LVL3 (GOWN DISPOSABLE) ×1 IMPLANT
GOWN STRL REUS W/ TWL XL LVL3 (GOWN DISPOSABLE) ×2 IMPLANT
GOWN STRL REUS W/TWL LRG LVL3 (GOWN DISPOSABLE) ×2
GOWN STRL REUS W/TWL XL LVL3 (GOWN DISPOSABLE) ×4
KIT BASIN OR (CUSTOM PROCEDURE TRAY) ×3 IMPLANT
KIT ROOM TURNOVER OR (KITS) ×3 IMPLANT
NEEDLE 18GX1X1/2 (RX/OR ONLY) (NEEDLE) ×3 IMPLANT
NEEDLE 22X1 1/2 (OR ONLY) (NEEDLE) ×3 IMPLANT
NEEDLE HYPO 25GX1X1/2 BEV (NEEDLE) ×3 IMPLANT
NS IRRIG 1000ML POUR BTL (IV SOLUTION) ×3 IMPLANT
PACK SURGICAL SETUP 50X90 (CUSTOM PROCEDURE TRAY) ×3 IMPLANT
PAD ARMBOARD 7.5X6 YLW CONV (MISCELLANEOUS) ×6 IMPLANT
SPONGE GAUZE 2X2 STER 10/PKG (GAUZE/BANDAGES/DRESSINGS) ×2
SUT ETHILON 3 0 PS 1 (SUTURE) ×3 IMPLANT
SUT VICRYL 4-0 PS2 18IN ABS (SUTURE) ×3 IMPLANT
SYR 20CC LL (SYRINGE) ×6 IMPLANT
SYR 5ML LL (SYRINGE) ×6 IMPLANT
SYR CONTROL 10ML LL (SYRINGE) ×3 IMPLANT
SYRINGE 10CC LL (SYRINGE) ×3 IMPLANT
TAPE CLOTH SURG 4X10 WHT LF (GAUZE/BANDAGES/DRESSINGS) ×3 IMPLANT
TOWEL OR 17X24 6PK STRL BLUE (TOWEL DISPOSABLE) ×3 IMPLANT
TOWEL OR 17X26 10 PK STRL BLUE (TOWEL DISPOSABLE) ×3 IMPLANT
WATER STERILE IRR 1000ML POUR (IV SOLUTION) IMPLANT

## 2014-05-31 NOTE — Progress Notes (Signed)
[facility] says they are unable to dialyze pt today because of late hour. Dr Scot Dock here and aware and he has updated Ernest Haber renal PA. Also aware potassium 5.0  Plan is for pt to dc back to [facility] by [facility] transportation and he will have HD tomorrow at his reg time of 1030am. No other orders from Dr Scot Dock or renal PA Zeyfang. Nursing supervisor Romie Minus at [facility] fully updated re all of above. Report to to Magazine features editor at [facility].

## 2014-05-31 NOTE — Progress Notes (Signed)
On arrival to Short Stay femoral dialysis completely out lying in patient diaper.

## 2014-05-31 NOTE — H&P (Signed)
Vascular and Vein Specialist of [facility]  Patient name: Tyrone Berry MRN: [id number] DOB: 07-04-1951 Sex: male  REASON FOR CONSULT: Needs new catheter.  HPI: Tyrone Berry is a 63 y.o. male who dialyzes on MWF. He had a right femoral catheter which came out and I was asked to place a new one. This was put in at [facility].   Past Medical History  Diagnosis Date  . Diabetes mellitus   . Neuropathy   . Hypertension   . Dialysis patient   . Hepatitis C   . Anemia   . Stroke   . PVD (peripheral vascular disease)   . Cirrhosis   . S/P liver transplant   . COPD (chronic obstructive pulmonary disease)   . Coronary artery disease   . CHF (congestive heart failure)   . A-fib   . Prostate hypertrophy   . Brain neoplasm   . Cholecystitis   . Seizures     not seen on history at rehab center  . ESRD (end stage renal disease)     dialysis    History reviewed. No pertinent family history.  SOCIAL HISTORY: History  Substance Use Topics  . Smoking status: Never Smoker   . Smokeless tobacco: Not on file  . Alcohol Use: No    Allergies  Allergen Reactions  . Eggs Or Egg-Derived Products Other (See Comments)    Unknown    Current Facility-Administered Medications  Medication Dose Route Frequency Provider Last Rate Last Dose  . 0.9 %  sodium chloride infusion   Intravenous Continuous Mal Misty, MD      . cefUROXime (ZINACEF) 1.5 g in dextrose 5 % 50 mL IVPB  1.5 g Intravenous 30 min Pre-Op Mal Misty, MD      . Chlorhexidine Gluconate Cloth 2 % PADS 6 each  6 each Topical Once Mal Misty, MD       And  . Chlorhexidine Gluconate Cloth 2 % PADS 6 each  6 each Topical Once Mal Misty, MD        REVIEW OF SYSTEMS: Valu.Nieves ] denotes positive finding; [  ] denotes negative finding CARDIOVASCULAR:  [ ]  chest pain   [ ]  chest pressure   [ ]  palpitations   [ ]  orthopnea   [ ]  dyspnea on exertion   [ ]  claudication   [ ]  rest pain   [ ]  DVT   [ ]  phlebitis PULMONARY:    [ ]  productive cough   [ ]  asthma   [ ]  wheezing NEUROLOGIC:   [ ]  weakness  [ ]  paresthesias  [ ]  aphasia  [ ]  amaurosis  [ ]  dizziness HEMATOLOGIC:   [ ]  bleeding problems   [ ]  clotting disorders MUSCULOSKELETAL:  [ ]  joint pain   [ ]  joint swelling [ ]  leg swelling GASTROINTESTINAL: [ ]   blood in stool  [ ]   hematemesis GENITOURINARY:  [ ]   dysuria  [ ]   hematuria PSYCHIATRIC:  [ ]  history of major depression INTEGUMENTARY:  [ ]  rashes  [ ]  ulcers CONSTITUTIONAL:  [ ]  fever   [ ]  chills  PHYSICAL EXAM: Filed Vitals:   05/31/14 0753  BP: 92/53  Pulse: 100  Temp: 98.1 F (36.7 C)  TempSrc: Oral  Resp: 20  Height: 6\' 1"  (1.854 m)  Weight: 226 lb (102.513 kg)  SpO2: 100%   Body mass index is 29.82 kg/(m^2). GENERAL: The patient is a well-nourished male, in no acute  distress. The vital signs are documented above. CARDIOVASCULAR: There is a regular rate and rhythm.  PULMONARY: There is good air exchange bilaterally without wheezing or rales. ABDOMEN: Soft and non-tender with normal pitched bowel sounds.  MUSCULOSKELETAL: There are no major deformities or cyanosis. NEUROLOGIC: No focal weakness or paresthesias are detected. SKIN: There are no ulcers or rashes noted. PSYCHIATRIC: The patient has a normal affect.  DATA:  Lab Results  Component Value Date   WBC 5.3 04/16/2012   HGB 13.6 05/31/2014   HCT 40.0 05/31/2014   MCV 87.5 04/16/2012   PLT 181 04/16/2012   Lab Results  Component Value Date   NA 131* 05/31/2014   K 5.0 05/31/2014   CL 97 04/16/2012   CO2 29 04/16/2012   Lab Results  Component Value Date   CREATININE 5.70* 04/16/2012   No results found for this basename: INR, PROTIME   No results found for this basename: HGBA1C   CBG (last 3)   Recent Labs  05/31/14 0731 05/31/14 0932  GLUCAP 139* 145*   MEDICAL ISSUES: Plan placement of new femoral diatek catheter. Procdure and risks discussed with patient.   [place] [facility]: [phone]

## 2014-05-31 NOTE — Transfer of Care (Signed)
Immediate Anesthesia Transfer of Care Note  Patient: Tyrone Berry  Procedure(s) Performed: Procedure(s): REPLACEMENT OF FEMORAL DIALYSIS CATHETER (Right)  Patient Location: PACU  Anesthesia Type:MAC  Level of Consciousness: awake, alert  and oriented  Airway & Oxygen Therapy: Patient Spontanous Breathing and Patient connected to face mask oxygen  Post-op Assessment: Report given to PACU RN  Post vital signs: Reviewed and stable  Complications: No apparent anesthesia complications

## 2014-05-31 NOTE — Anesthesia Preprocedure Evaluation (Addendum)
Anesthesia Evaluation  Patient identified by MRN, date of birth, ID band  Reviewed: Allergy & Precautions, NPO status , Patient's Chart, lab work & pertinent test results, reviewed documented beta blocker date and time   History of Anesthesia Complications Negative for: history of anesthetic complications  Airway Mallampati: III TM Distance: >3 FB Neck ROM: Full    Dental  (+) Edentulous Upper, Edentulous Lower   Pulmonary pneumonia -, COPD breath sounds clear to auscultation        Cardiovascular hypertension, + CAD, + Past MI, + Peripheral Vascular Disease and +CHF Rhythm:Regular Rate:Normal - Systolic murmurs    Neuro/Psych Seizures -,  CVA    GI/Hepatic negative GI ROS, (+) Hepatitis -S/p Liver transplant in 2005. Labs from 04/2014 reviewed- elevated ALP 205. Normal range albumin, AST, ALT. Pt with regular follow-up with transplant physician.   Endo/Other  diabetes, Type 2, Insulin Dependent  Renal/GU ESRF and DialysisRenal diseaseK- 5.0  negative genitourinary   Musculoskeletal negative musculoskeletal ROS (+)   Abdominal   Peds  Hematology  (+) anemia ,   Anesthesia Other Findings   Reproductive/Obstetrics negative OB ROS                       Anesthesia Physical Anesthesia Plan  ASA: III  Anesthesia Plan: MAC   Post-op Pain Management:    Induction: Intravenous  Airway Management Planned: LMA  Additional Equipment: None  Intra-op Plan:   Post-operative Plan: Extubation in OR  Informed Consent: I have reviewed the patients History and Physical, chart, labs and discussed the procedure including the risks, benefits and alternatives for the proposed anesthesia with the patient or authorized representative who has indicated his/her understanding and acceptance.     Plan Discussed with: CRNA and Surgeon  Anesthesia Plan Comments:      Anesthesia Quick Evaluation

## 2014-05-31 NOTE — Discharge Instructions (Signed)

## 2014-05-31 NOTE — Progress Notes (Addendum)
Chest xray report was called to Dr. Ermalene Postin.Opal Sidles in radiololgy called report and it was recommended patient have chest CT.)   Dr. Ermalene Postin stated he would talk to Dr. Kellie Simmering.

## 2014-05-31 NOTE — Anesthesia Postprocedure Evaluation (Signed)
  Anesthesia Post-op Note  Patient: Tyrone Berry  Procedure(s) Performed: Procedure(s): REPLACEMENT OF FEMORAL DIALYSIS CATHETER (Right)  Patient Location: PACU  Anesthesia Type:MAC  Level of Consciousness: awake, alert  and oriented  Airway and Oxygen Therapy: Patient Spontanous Breathing  Post-op Pain: none  Post-op Assessment: Post-op Vital signs reviewed  Post-op Vital Signs: Reviewed  Last Vitals:  Filed Vitals:   05/31/14 1242  BP:   Pulse:   Temp: 36.1 C  Resp: 15    Complications: No apparent anesthesia complications

## 2014-05-31 NOTE — Op Note (Signed)
    NAME: Tyrone Berry    MRN: [id number] DOB: 1951-04-11    DATE OF OPERATION: 05/31/2014  PREOP DIAGNOSIS: Stage V chronic kidney disease  POSTOP DIAGNOSIS: same  PROCEDURE: Ultrasound-guided placement of right femoral 50 cm tunneled dialysis catheter  SURGEON: Judeth Cornfield. Scot Dock, MD, FACS  ASSIST: none  ANESTHESIA: local with sedation   EBL: minimal  INDICATIONS: [name] is a 63 y.o. male who had a right femoral catheter that was placed at [facility]. This was pulled out and we were asked to place a new catheter.  FINDINGS: right femoral vein was patent.  TECHNIQUE: Patient was taken to the operating room and sedated by anesthesia. Both groins were prepped and draped in usual sterile fashion. Under ultrasound guidance, after the skin was anesthetized, the right femoral vein was cannulated and a guidewire introduced into the inferior vena cava under fluoroscopic control. The tract of the wire was dilated and the dilator and peel-away sheath were advanced over the wire and then the wire removed. The catheter was passed through the peel-away sheath and positioned in the superior vena cava just below the right atrium.  I tried to advance the catheter into the right atrium however the catheter would not reach. I injected contrast to try to better locate the position of the right atrium. This was as far as the catheter would reach. Both ports withdrew easily and then flushed with heparinized saline filled concentrated heparin. The catheter was secured at its exit site with 3-0 nylon suture. The cannulation site was closed with 4-0 Monocryl suture. Sterile dressing was applied. The patient tolerated the procedure well transferred to the recovery room in stable condition. All needle and sponge counts were correct.  [name], MD, FACS [facility]  DATE OF DICTATION:   05/31/2014

## 2014-06-04 ENCOUNTER — Encounter (HOSPITAL_COMMUNITY): Payer: Self-pay | Admitting: Vascular Surgery

## 2014-06-17 IMAGING — CT CT L SPINE W/O CM
3 series · 15 of 33 positions shown, 18 images · non-contrast
Comparison: Plain radiographs 05/22/2012

CT THORACIC SPINE

CLINICAL DATA: Fall with pain.  Possible fractures of T12-L1 on
plain films.

CT THORACIC AND LUMBAR SPINE WITHOUT CONTRAST
TECHNIQUE: Multidetector CT imaging of the thoracic and lumbar
spine was performed without contrast. Multiplanar CT image
reconstructions were also generated.

[Series 4: spine 2.0 b31s st · axial · 0.46mm/px · z∈[-531,-305]mm · 7 of 135 slices shown, 9 images]
[im 11/135  soft-tissue]
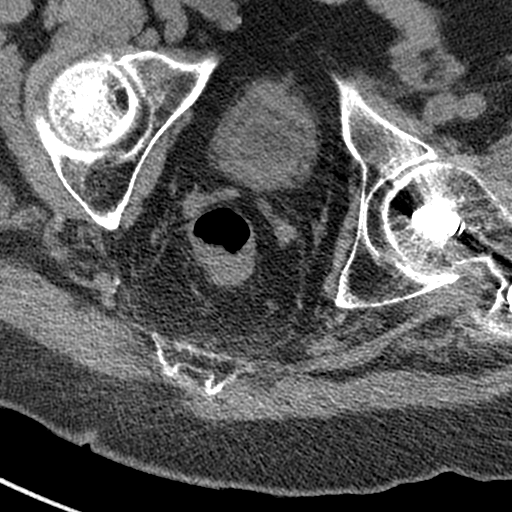
[im 11/135  bone]
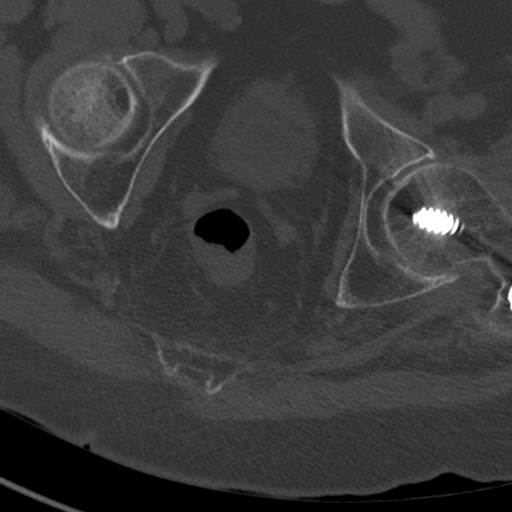
[im 31/135  bone]
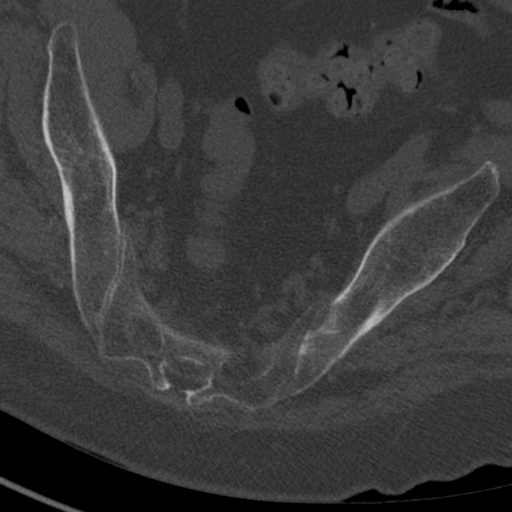
[im 52/135  bone]
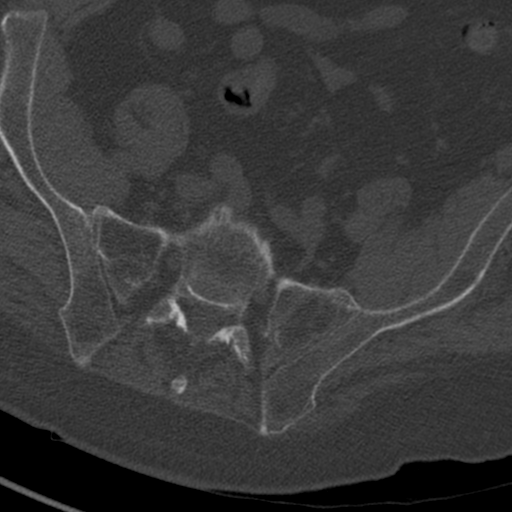
[im 73/135  bone]
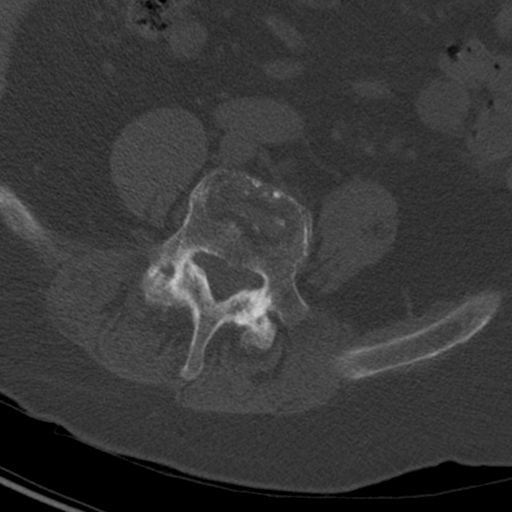
[im 83/135  soft-tissue]
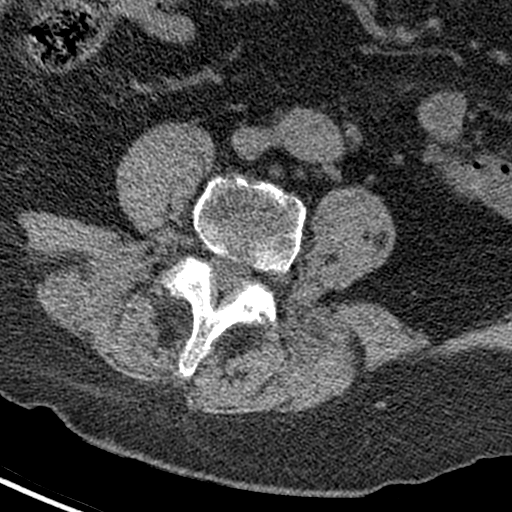
[im 83/135  bone]
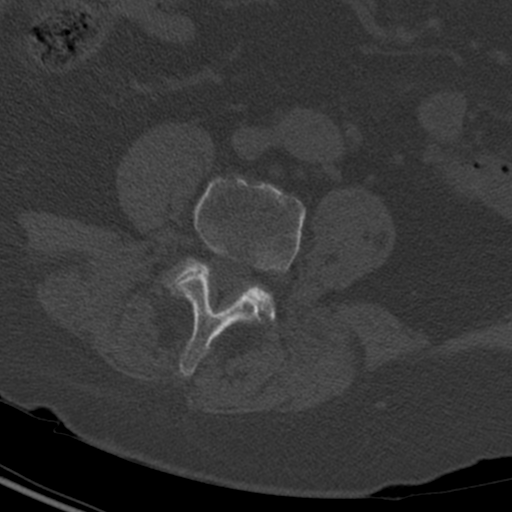
[im 104/135  bone]
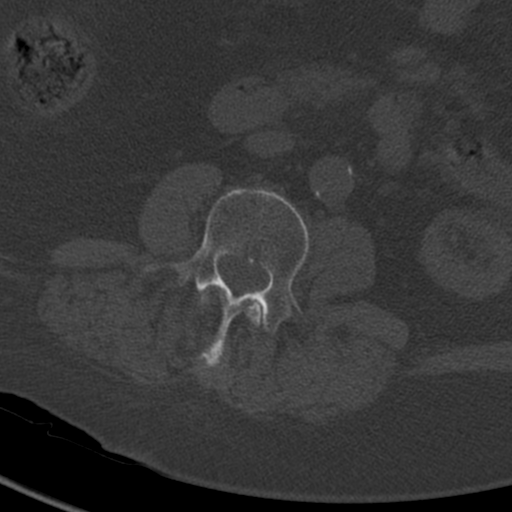
[im 124/135  bone]
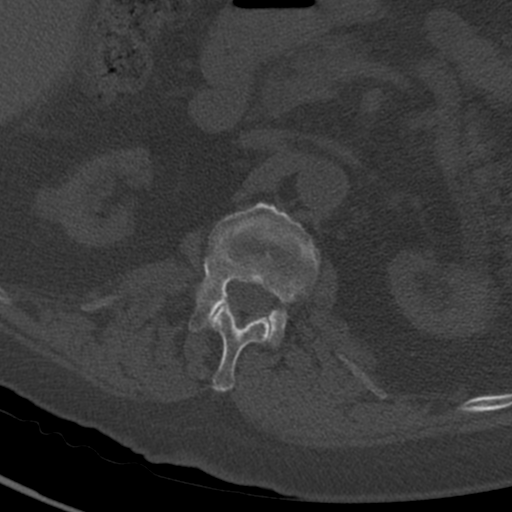

[Series 7: spine 2.0 sagittal bone · sagittal · 0.53mm/px · 5 of 67 slices shown, 6 images]
[im 23/67  bone]
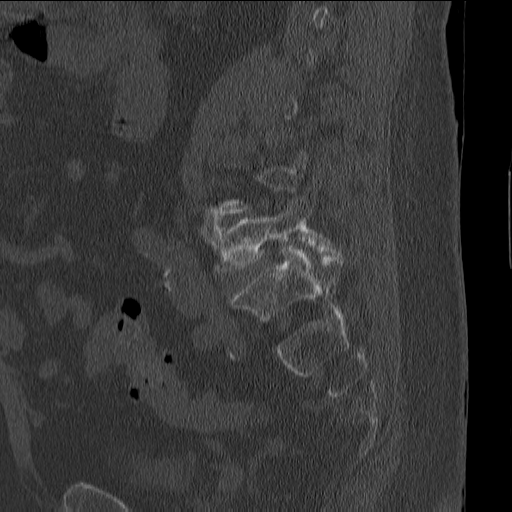
[im 28/67  bone]
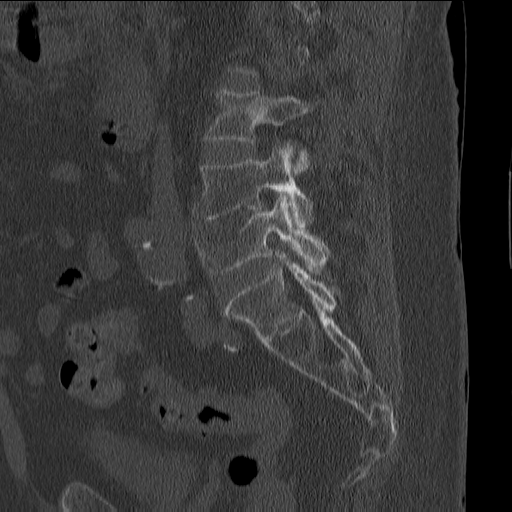
[im 34/67  soft-tissue]
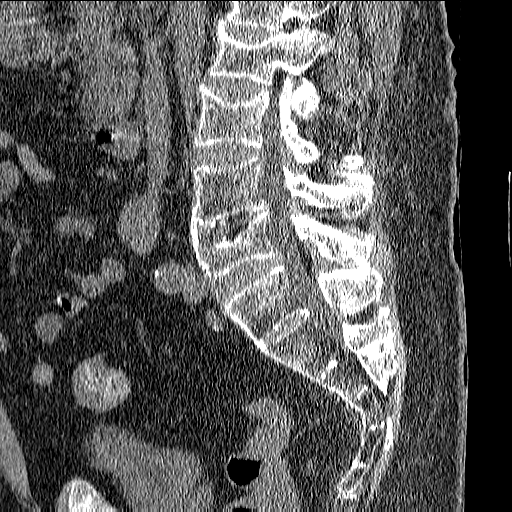
[im 34/67  bone]
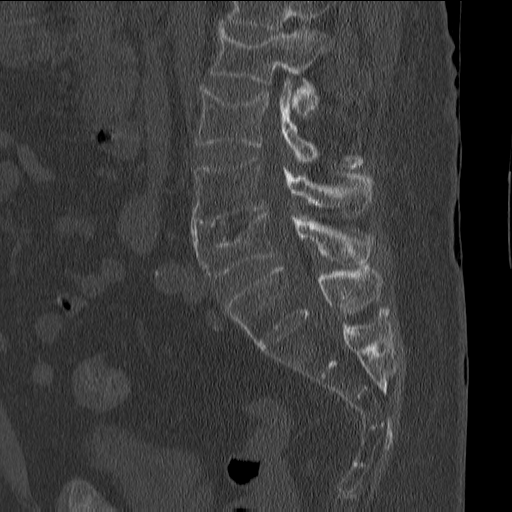
[im 39/67  bone]
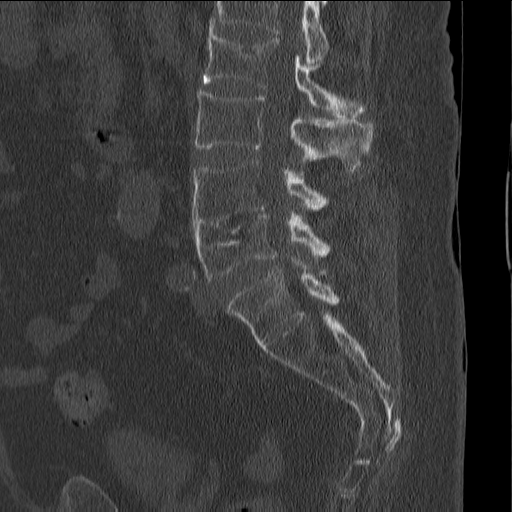
[im 45/67  bone]
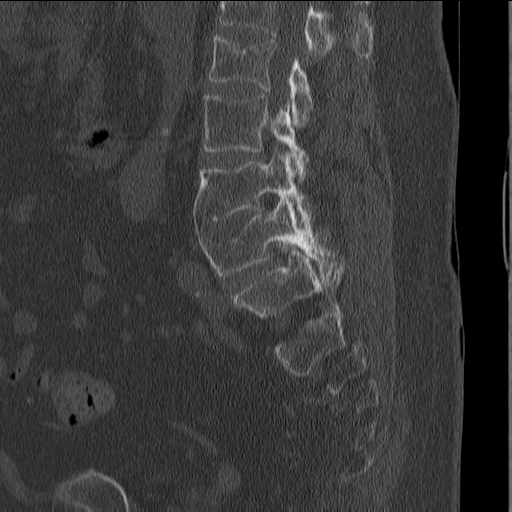

[Series 8: spine 2.0 coronal st · coronal · 0.49mm/px · 3 of 56 slices shown]
[im 12/56  bone]
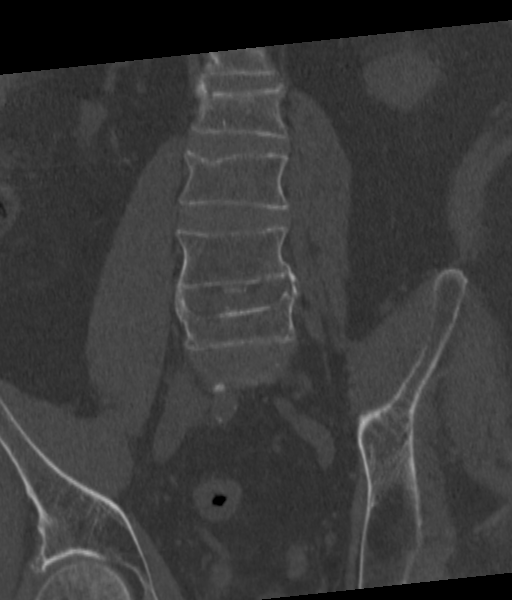
[im 23/56  bone]
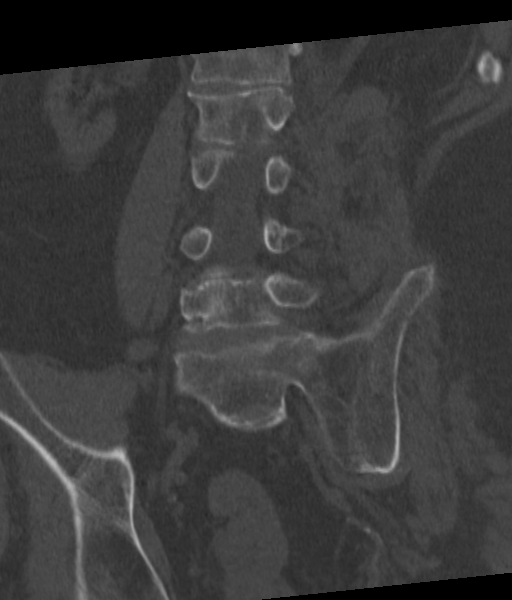
[im 34/56  bone]
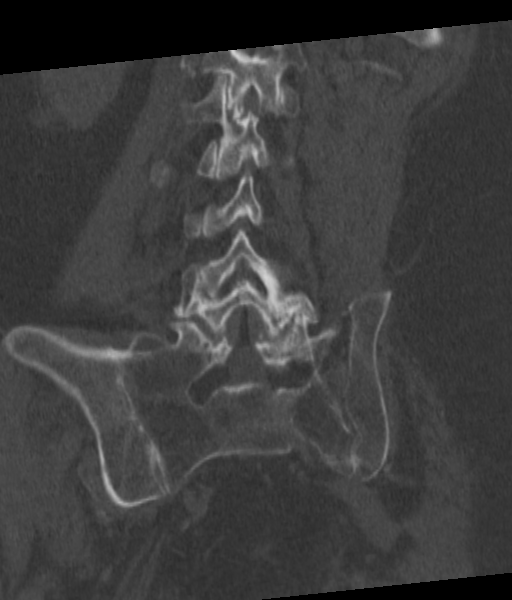

[15 of 33 positions shown; findings below may reference images not displayed]

FINDINGS: The normal alignment of the thoracic vertebrae.  Diffuse
bone demineralization.  Endplate compression deformities at T10 and
T12.  Associated endplate hypertrophic degenerative changes are
present and there is no paraspinal hematoma at these levels,
suggesting views to represent chronic change.  No retropulsion of
fracture fragments.  No focal bone lesion or bone destruction.
IMPRESSION: Chronic-appearing compression fractures at T10 and T12, likely due
to osteoporosis.

CT LUMBAR SPINE
FINDINGS: Anterior endplate compression fractures of L1, L2, and
L5.  There are associated degenerative changes at these levels and
there is no significant paraspinal hematoma, suggesting these
represent acute change.  No retropulsion of fracture fragments.  No
focal bone lesion or bone destruction.  Postoperative changes in
the left hip.  Diffuse bone demineralization.  14 mm exophytic
lesion arising from the right kidney.  This is not definitely
cystic and could represent a solid lesion.  Consider ultrasound for
further evaluation to exclude a solid renal lesion.
IMPRESSION: Diffuse bone demineralization with compression fractures of L1, L2,
and L5.  No acute changes are demonstrated, suggesting chronic
compression, likely due to osteoporosis.  Indeterminate 14 mm
lesion arising from the right kidney.

## 2015-02-23 NOTE — Op Note (Signed)
PATIENT NAME:  Tyrone Berry, Tyrone Berry MR#:  [id number] DATE OF BIRTH:  October 15, 1951  DATE OF PROCEDURE:  05/17/2014  PREOPERATIVE DIAGNOSES: 1.  End-stage renal disease with difficult dialysis access.  2.  Multiple failed previous dialysis accesses.  3.  Hypertension.   POSTOPERATIVE DIAGNOSES:  1.  End-stage renal disease with difficult dialysis access.  2.  Multiple failed previous dialysis accesses.  3.  Hypertension.   PROCEDURES PERFORMED: 1.  Ultrasound guidance for vascular access to left brachial vein.  2.  Left upper extremity and central venogram.   SURGEON: Algernon Huxley, M.D.   ANESTHESIA: Local with moderate conscious sedation.   ESTIMATED BLOOD LOSS: Minimal.   FLUOROSCOPY TIME: Approximately 3 minutes.   CONTRAST USED: 20 mL.   INDICATION FOR PROCEDURE: A 63 year old African American male with end-stage renal disease. He has had multiple failed previous accesses. He really desires a left upper extremity access if at all possible. He has already had an upper arm and a lower arm graft fail. A venogram was performed to see what our options are and see if a HeRO graft may be an appropriate option. Risks and benefits were discussed. Informed consent was obtained.   DESCRIPTION OF PROCEDURE: The patient was brought to the vascular suite. The left upper extremity was sterilely prepped and draped and a sterile surgical field was created. The left jugular vein was previously visualized with ultrasound and found to be patent to the clavicle. I accessed the left brachial vein. Under ultrasound guidance without difficulty a micropuncture needle and micropuncture wire and sheath were then placed and imaging of the left upper extremity was then performed. This showed patent left brachial vein. Axillary vein is patent after previous brachial axillary graft. His subclavian vein was patent. A small stump of the left jugular vein could be seen until the valves would not allow reflux of blood up into the  neck. I probed this with a Kumpe catheter, but still would not reflux blood into the neck, but was seen to be patent on ultrasound. The innominate veins at the superior vena cava were all widely patent. At this point, I elected to terminate the procedure, and he does have what appears to be adequate anatomy for left arm HeRO graft.   ____________________________ Algernon Huxley, MD jsd:[id number] D: 05/17/2014 10:54:32 ET T: 05/17/2014 12:01:50 ET JOB#: [id number]  cc: Algernon Huxley, MD, <Dictator> Algernon Huxley MD ELECTRONICALLY SIGNED 06/07/2014 12:41

## 2016-06-25 IMAGING — CR DG CHEST 2V
2 series · 2 of 2 positions shown · non-contrast
Comparison: Portable chest x-ray dated December 09, 2013, and PA and
lateral chest x-ray December 07, 2013.

CLINICAL DATA: Preoperative study prior to femoral dialysis
catheter placement ; history of COPD and liver transplant

EXAM:
CHEST  2 VIEW

[w chest pa]
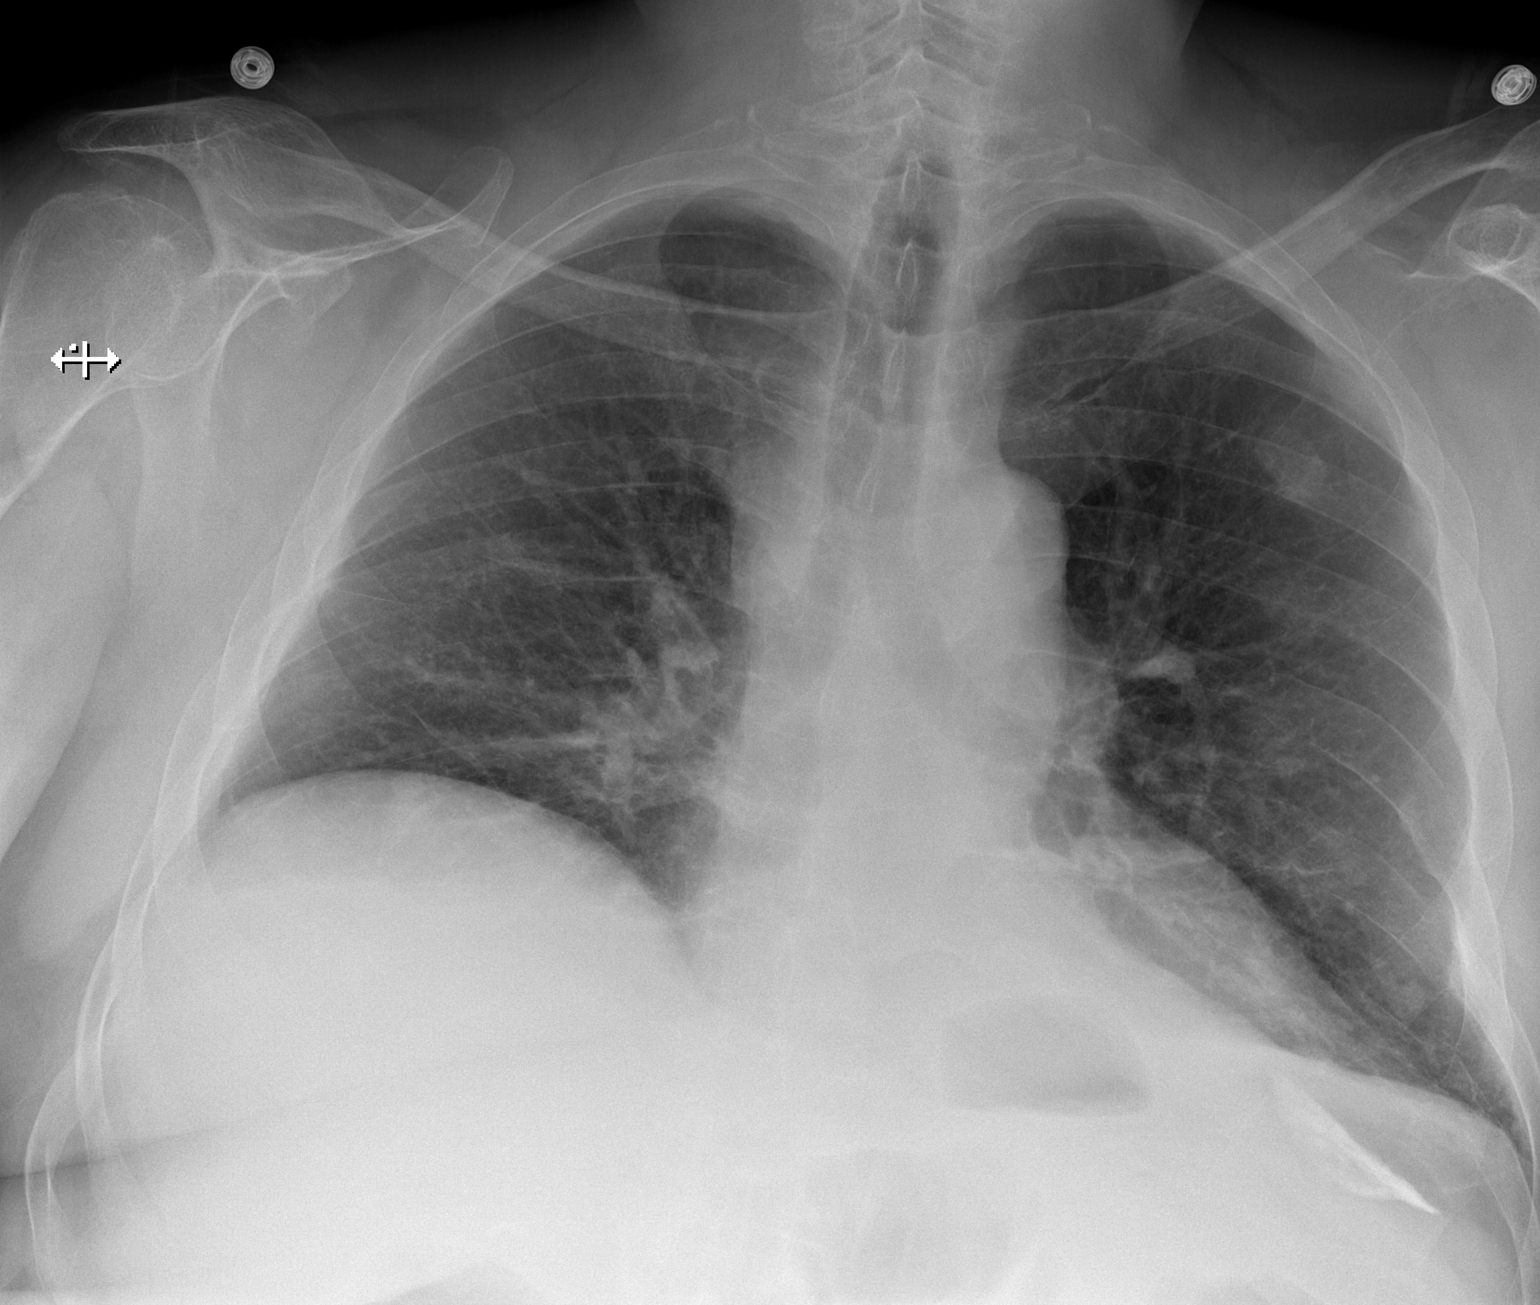

[w chest lat]
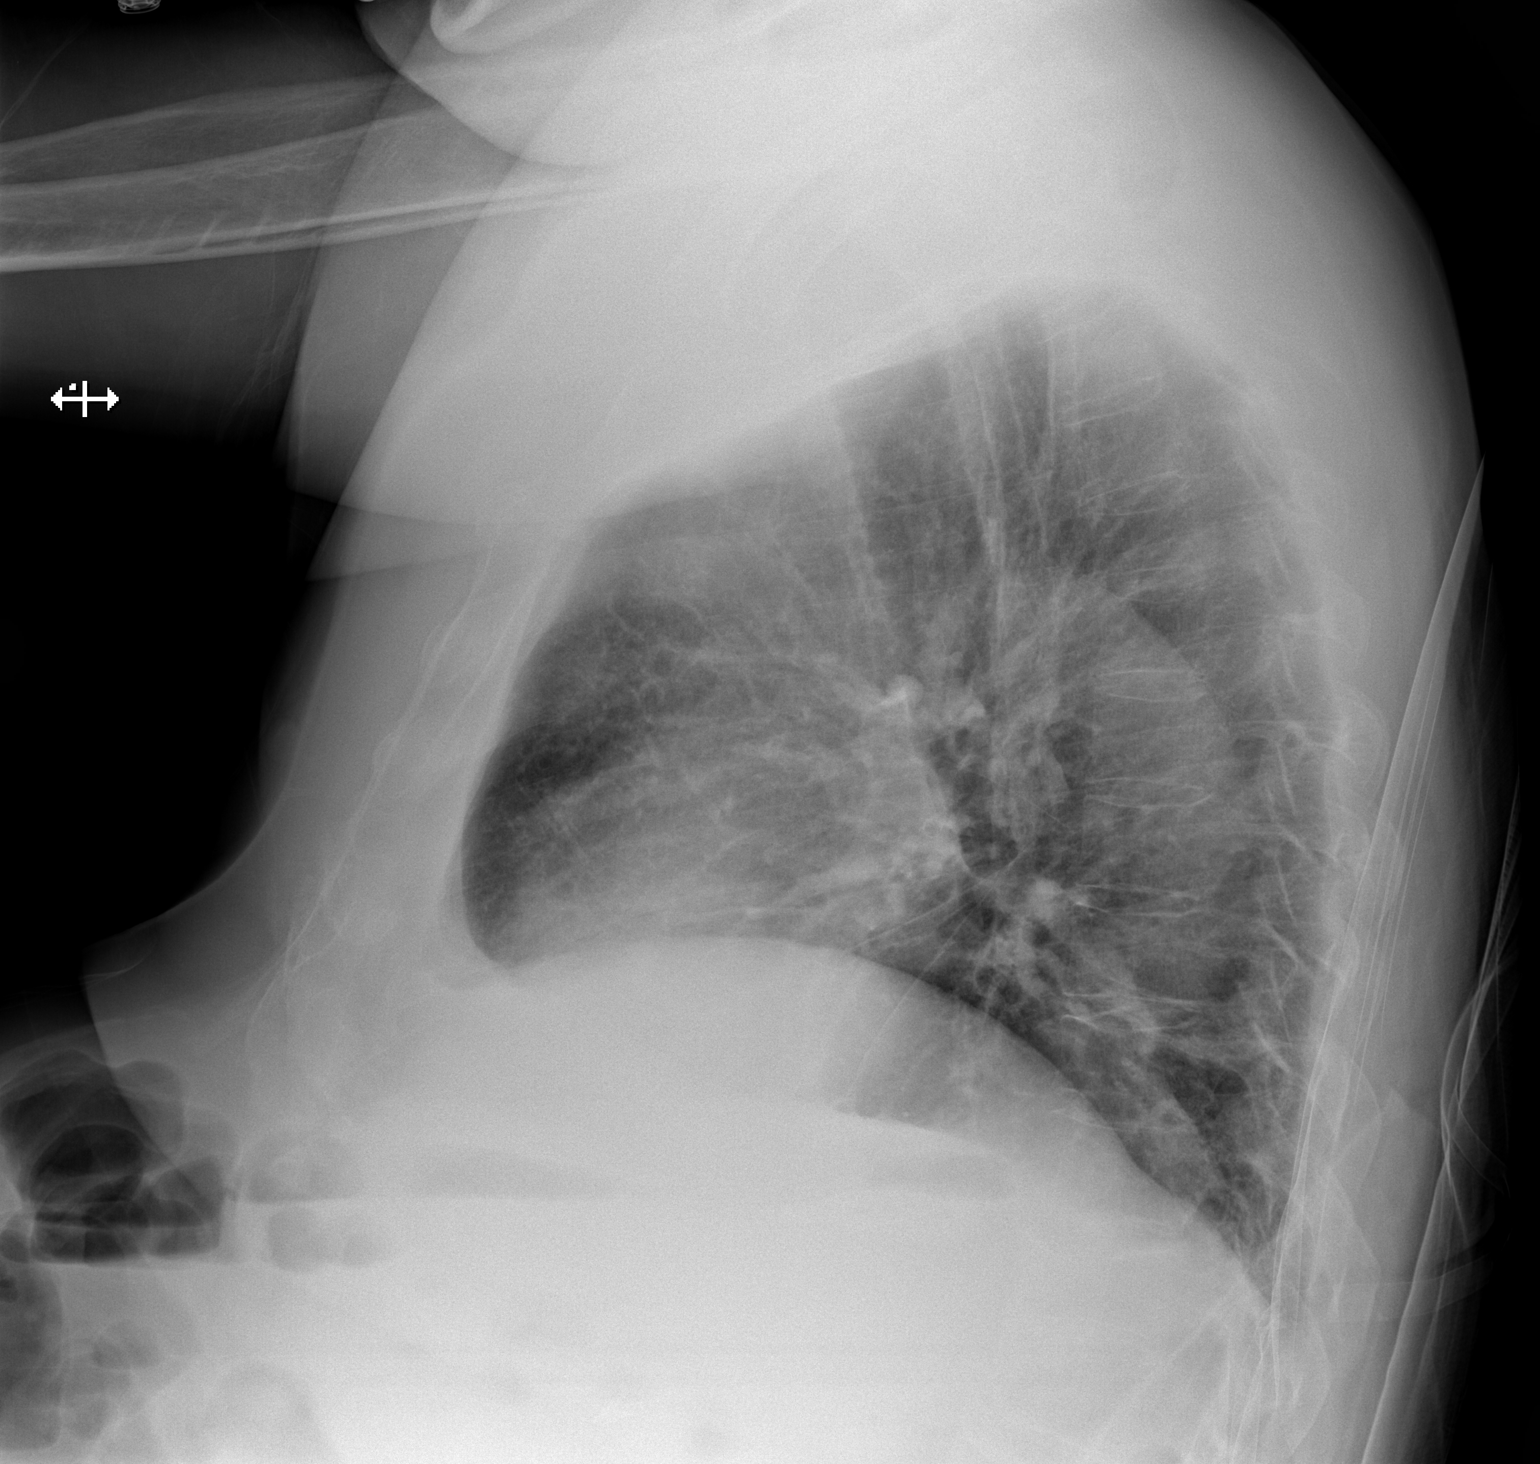

[2 of 2 positions shown; findings below may reference images not displayed]

FINDINGS: The lungs are hypoinflated. There is increased density in the left
upper hemi thorax overlying the anterior aspect of the first rib.
There is old deformity of the lateral aspect of the left seventh and
eighth ribs. There is density lateral to the cardiac apex which may
reflect a nipple shadow. The right lung exhibits no acute
abnormality. The cardiac silhouette and pulmonary vascularity are
unremarkable.
IMPRESSION: There are new densities in the left upper lobe and lateral to the
left cardiac apex which may reflect interval development of
pulmonary parenchymal nodules. There is no alveolar pneumonia nor
evidence of CHF. Chest CT scanning is recommended.

These results will be called to the ordering clinician or
representative by the Radiologist Assistant, and communication
documented in the PACS or zVision Dashboard.

## 2017-04-02 DEATH — deceased
